# Patient Record
Sex: Male | Born: 1980 | Race: White | Hispanic: No | Marital: Married | Smoking: Never smoker
Health system: Southern US, Community
[De-identification: ages and names within clinical notes are randomized; demographics above are authoritative.]

## PROBLEM LIST (undated history)

## (undated) DIAGNOSIS — J358 Other chronic diseases of tonsils and adenoids: Secondary | ICD-10-CM

## (undated) DIAGNOSIS — Z9889 Other specified postprocedural states: Secondary | ICD-10-CM

## (undated) DIAGNOSIS — I252 Old myocardial infarction: Secondary | ICD-10-CM

## (undated) DIAGNOSIS — J039 Acute tonsillitis, unspecified: Secondary | ICD-10-CM

## (undated) DIAGNOSIS — I10 Essential (primary) hypertension: Secondary | ICD-10-CM

## (undated) HISTORY — DX: Essential (primary) hypertension: I10

## (undated) HISTORY — PX: APPENDECTOMY: SHX54

---

## 2006-11-24 ENCOUNTER — Emergency Department (HOSPITAL_COMMUNITY): Admission: EM | Admit: 2006-11-24 | Discharge: 2006-11-24 | Payer: Self-pay | Admitting: Emergency Medicine

## 2007-09-17 ENCOUNTER — Emergency Department (HOSPITAL_COMMUNITY): Admission: EM | Admit: 2007-09-17 | Discharge: 2007-09-17 | Payer: Self-pay | Admitting: Emergency Medicine

## 2010-04-04 ENCOUNTER — Emergency Department (HOSPITAL_COMMUNITY)
Admission: EM | Admit: 2010-04-04 | Discharge: 2010-04-04 | Disposition: A | Discharge status: Home / Self Care | Payer: BC Managed Care – PPO | Attending: Emergency Medicine | Admitting: Emergency Medicine

## 2010-04-04 DIAGNOSIS — J069 Acute upper respiratory infection, unspecified: Secondary | ICD-10-CM | POA: Insufficient documentation

## 2010-04-04 DIAGNOSIS — IMO0001 Reserved for inherently not codable concepts without codable children: Secondary | ICD-10-CM | POA: Insufficient documentation

## 2010-04-04 DIAGNOSIS — R509 Fever, unspecified: Secondary | ICD-10-CM | POA: Insufficient documentation

## 2010-04-04 DIAGNOSIS — J3489 Other specified disorders of nose and nasal sinuses: Secondary | ICD-10-CM | POA: Insufficient documentation

## 2010-12-07 LAB — CULTURE, ROUTINE-ABSCESS: Gram Stain: NONE SEEN

## 2011-02-27 DIAGNOSIS — Z9889 Other specified postprocedural states: Secondary | ICD-10-CM

## 2011-02-27 HISTORY — DX: Other specified postprocedural states: Z98.890

## 2011-05-18 ENCOUNTER — Other Ambulatory Visit: Payer: Self-pay | Admitting: Cardiovascular Disease

## 2011-05-18 ENCOUNTER — Encounter: Payer: Self-pay | Admitting: Unknown Physician Specialty

## 2011-05-18 ENCOUNTER — Inpatient Hospital Stay (HOSPITAL_COMMUNITY)
Admission: AD | Admit: 2011-05-18 | Discharge: 2011-05-20 | DRG: 122 | Disposition: A | Discharge status: Home / Self Care | Payer: BC Managed Care – PPO | Source: Other Acute Inpatient Hospital | Attending: Internal Medicine | Admitting: Internal Medicine

## 2011-05-18 ENCOUNTER — Encounter (HOSPITAL_COMMUNITY)
Admission: AD | Disposition: A | Discharge status: Home / Self Care | Payer: Self-pay | Source: Other Acute Inpatient Hospital | Attending: Internal Medicine

## 2011-05-18 DIAGNOSIS — R0781 Pleurodynia: Secondary | ICD-10-CM

## 2011-05-18 DIAGNOSIS — I201 Angina pectoris with documented spasm: Secondary | ICD-10-CM

## 2011-05-18 DIAGNOSIS — Z7982 Long term (current) use of aspirin: Secondary | ICD-10-CM

## 2011-05-18 DIAGNOSIS — I214 Non-ST elevation (NSTEMI) myocardial infarction: Principal | ICD-10-CM | POA: Diagnosis present

## 2011-05-18 DIAGNOSIS — R079 Chest pain, unspecified: Secondary | ICD-10-CM

## 2011-05-18 DIAGNOSIS — Z6827 Body mass index (BMI) 27.0-27.9, adult: Secondary | ICD-10-CM

## 2011-05-18 DIAGNOSIS — Z79899 Other long term (current) drug therapy: Secondary | ICD-10-CM

## 2011-05-18 DIAGNOSIS — I10 Essential (primary) hypertension: Secondary | ICD-10-CM | POA: Diagnosis present

## 2011-05-18 DIAGNOSIS — R071 Chest pain on breathing: Secondary | ICD-10-CM | POA: Diagnosis present

## 2011-05-18 HISTORY — PX: LEFT HEART CATHETERIZATION WITH CORONARY ANGIOGRAM: SHX5451

## 2011-05-18 SURGERY — LEFT HEART CATHETERIZATION WITH CORONARY ANGIOGRAM
Anesthesia: LOCAL

## 2011-05-18 MED ORDER — ACETAMINOPHEN 325 MG PO TABS
650.0000 mg | ORAL_TABLET | ORAL | Status: DC | PRN
  Administered 2011-05-18 – 2011-05-19 (×2): 650 mg via ORAL
  Filled 2011-05-18 (×3): qty 2

## 2011-05-18 MED ORDER — NITROGLYCERIN 0.4 MG SL SUBL
0.4000 mg | SUBLINGUAL_TABLET | SUBLINGUAL | Status: DC | PRN
  Administered 2011-05-18 – 2011-05-19 (×4): 0.4 mg via SUBLINGUAL
  Filled 2011-05-18: qty 25

## 2011-05-18 MED ORDER — ASPIRIN 81 MG PO CHEW: 324.0000 mg | CHEWABLE_TABLET | ORAL | Status: DC

## 2011-05-18 MED ORDER — MIDAZOLAM HCL 2 MG/2ML IJ SOLN
INTRAMUSCULAR | Status: AC
  Filled 2011-05-18: qty 2

## 2011-05-18 MED ORDER — NITROGLYCERIN 0.2 MG/ML ON CALL CATH LAB
INTRAVENOUS | Status: AC
  Filled 2011-05-18: qty 1

## 2011-05-18 MED ORDER — SODIUM CHLORIDE 0.9 % IJ SOLN: 3.0000 mL | Freq: Two times a day (BID) | INTRAMUSCULAR | Status: DC

## 2011-05-18 MED ORDER — SODIUM CHLORIDE 0.9 % IJ SOLN: 3.0000 mL | INTRAMUSCULAR | Status: DC | PRN

## 2011-05-18 MED ORDER — ACETAMINOPHEN 325 MG PO TABS: 650.0000 mg | ORAL_TABLET | ORAL | Status: DC | PRN

## 2011-05-18 MED ORDER — METOPROLOL TARTRATE 25 MG PO TABS
25.0000 mg | ORAL_TABLET | Freq: Two times a day (BID) | ORAL | Status: DC
  Administered 2011-05-18: 25 mg via ORAL
  Filled 2011-05-18 (×3): qty 1

## 2011-05-18 MED ORDER — ASPIRIN 81 MG PO CHEW
324.0000 mg | CHEWABLE_TABLET | ORAL | Status: AC
  Administered 2011-05-18: 324 mg via ORAL
  Filled 2011-05-18: qty 4

## 2011-05-18 MED ORDER — SODIUM CHLORIDE 0.9 % IV SOLN
INTRAVENOUS | Status: DC
  Administered 2011-05-19 – 2011-05-20 (×4): via INTRAVENOUS

## 2011-05-18 MED ORDER — HEPARIN (PORCINE) IN NACL 2-0.9 UNIT/ML-% IJ SOLN
INTRAMUSCULAR | Status: AC
  Filled 2011-05-18: qty 2000

## 2011-05-18 MED ORDER — LIDOCAINE HCL (PF) 1 % IJ SOLN
INTRAMUSCULAR | Status: AC
  Filled 2011-05-18: qty 30

## 2011-05-18 MED ORDER — ONDANSETRON HCL 4 MG/2ML IJ SOLN: 4.0000 mg | Freq: Four times a day (QID) | INTRAMUSCULAR | Status: DC | PRN

## 2011-05-18 MED ORDER — ASPIRIN EC 81 MG PO TBEC
81.0000 mg | DELAYED_RELEASE_TABLET | Freq: Every day | ORAL | Status: DC
  Administered 2011-05-19 – 2011-05-20 (×2): 81 mg via ORAL
  Filled 2011-05-18 (×2): qty 1

## 2011-05-18 MED ORDER — SODIUM CHLORIDE 0.9 % IV SOLN
250.0000 mL | INTRAVENOUS | Status: DC | PRN
Start: 2011-05-18 — End: 2011-05-18

## 2011-05-18 MED ORDER — ASPIRIN 300 MG RE SUPP
300.0000 mg | RECTAL | Status: AC
  Filled 2011-05-18: qty 1

## 2011-05-18 MED ORDER — FENTANYL CITRATE 0.05 MG/ML IJ SOLN
INTRAMUSCULAR | Status: AC
  Filled 2011-05-18: qty 2

## 2011-05-18 MED ORDER — SODIUM CHLORIDE 0.9 % IV SOLN
INTRAVENOUS | Status: DC
  Administered 2011-05-18: 17:00:00 via INTRAVENOUS

## 2011-05-18 MED ORDER — HEPARIN (PORCINE) IN NACL 100-0.45 UNIT/ML-% IJ SOLN
1000.0000 [IU]/h | INTRAMUSCULAR | Status: DC
  Filled 2011-05-18: qty 250

## 2011-05-18 MED ORDER — ASPIRIN 81 MG PO CHEW: 81.0000 mg | CHEWABLE_TABLET | Freq: Every day | ORAL | Status: DC

## 2011-05-18 NOTE — Op Note (Signed)
    Cardiac Cath Note  Donald Mcgrath [id number] Aug 13, 1980  Procedure: Left Heart Cardiac Catheterization Note Indications: Donald Mcgrath has been under lots of stress. He had some chest pain several days ago. He was subsequently found to have a troponin of 5.35. He has continued to have some very mild chest tightness.   Procedure Details Consent: Obtained Time Out: Verified patient identification, verified procedure, site/side was marked, verified correct patient position, special equipment/implants available, Radiology Safety Procedures followed,  medications/allergies/relevent history reviewed, required imaging and test results available.  Performed   Medications: Fentanyl: 50 mcg IV Versed: 2 mg IV  The right femoral artery was easily canulated using a modified Seldinger technique.  Hemodynamics:   LV pressure: 125/15 Aortic pressure: 121/85  Angiography   Left Main: The left main is smooth and normal.  Left anterior Descending: The left anterior descending artery is smooth and normal. There is a large first diagonal branch which is also smooth and normal.  Left Circumflex: The left circumflex artery is moderate in size. It is smooth and normal throughout its course. The first acute marginal artery is fairly small. The circumflex place artery terminates as a moderate-sized posterior lateral branch.  Ramus intermediate vessel:  The ramus intermediate vessel is smooth and normal. It is a fairly large branch.  Right Coronary Artery: The right coronary artery is large and is dominant.  It is smooth and normal throughout its course The posterior descending artery and the posterior lateral segment artery are normal.  LV Gram: The left ventriculogram was performed in the 30 RAO position.  The overall left ventricular systolic function is normal. He has a small area of akinesis in the basilar/mid anterior free wall.  I suspect that he had transient coronary spasm resulting in a very small  myocardial infarction.  Complications: No apparent complications Patient did tolerate procedure well.  Conclusions:   1. Smooth and normal coronary arteries 2. Overall normal left ventricle systolic function 3. Evidence of coronary spasm. He has a very small area of  anterior wall motion  abnormality. He has a very small area of akinesis in the basal/mid anterior wall. His coronary arteries are completely smooth and normal. I suspect that he may have had transient coronary spasm due to his stress.  Treated with low-dose beta blockers as tolerated. Anticipate discharge in the next day or so.  [name], Montez Hageman., MD, [facility] 05/18/2011, 7:04 PM

## 2011-05-18 NOTE — Plan of Care (Signed)
Problem: Phase I Progression Outcomes Goal: Aspirin unless contraindicated Outcome: Completed/Met Date Met:  05/18/11 Given at [facility]

## 2011-05-18 NOTE — Progress Notes (Signed)
ANTICOAGULATION CONSULT NOTE - Initial Consult  Pharmacy Consult for Heparin Indication: NSTEMI  No Known Allergies  Patient Measurements: Pt reported ht: 69 in Pt reported weight: 190 lbs   Heparin Dosing Weight: 85 kg  Vital Signs: Temp: 97.5 F (36.4 C) (03/22 1610) Temp src: Oral (03/22 1610) BP: 137/93 mmHg (03/22 1610) Pulse Rate: 68  (03/22 1610)  Labs: Baseline labs preformed at [facility]. No results found for this basename: HGB:2,HCT:3,PLT:3,APTT:3,LABPROT:3,INR:3,HEPARINUNFRC:3,CREATININE:3,CKTOTAL:3,CKMB:3,TROPONINI:3 in the last 72 hours CrCl is unknown because no creatinine reading has been taken and the patient has no height on file.  Medical History: No past medical history on file.  Medications:  Scheduled:    . aspirin  324 mg Oral Pre-Cath  . aspirin  324 mg Oral NOW   Or  . aspirin  300 mg Rectal NOW  . aspirin EC  81 mg Oral Daily  . metoprolol tartrate  25 mg Oral BID  . sodium chloride  3 mL Intravenous Q12H    Assessment: 31 yo M presents to [facility] with chest pain and +troponins dx with NSTEMI.  He was transferred to [facility] for cardiac cath today.  He is currently on heparin infusion 1000 units/hr started at 0915 today at [facility]. No bleeding reported.  Goal of Therapy:  Heparin level 0.3-0.7 units/ml   Plan:  1) Continue heparin 1000 units/hr 2) Follow-up after cardiac cath.  Will check heparin level if cath delayed.  Elson Clan 05/18/2011,4:16 PM

## 2011-05-18 NOTE — H&P (View-Only) (Signed)
NAME:  Donald Mcgrath, Donald Mcgrath ROOM:   UNIT NUMBER:  [id number] LOCATION: ER ADM/VISIT DATE:  05/18/2011   ADM PHYSKathaleen Grinder:  [id number] DOB: 07/05/1980   REASON FOR CONSULTATION:  Chest pain.  HISTORY OF PRESENT ILLNESS:  This is a 31 year old gentleman with no previous cardiac history.  He has past medical history of hypertension and gets treatment with metoprolol.  He reports that his father had myocardial infarction or a stroke in his 30s.  He is not aware of any other medical problems.  He has been under significant stress lately.  His wife had surgery Wednesday at [facility].  On that day, he had substernal chest discomfort which lasted about 30 minutes and resolved on its own.  He did not seek any medical attention.  He woke up at 2 in the morning with substernal aching and tightness feeling in his chest, radiating to the left arm, which lasted for about 2 hours.  It was associated with nausea, but no other symptoms.  It did not worsen with deep breath or coughing.  The patient did have a recent upper respiratory tract infection, with significant sore that started on Sunday.  He did not get treatment for this.  He denies any fevers or chills.  The patient was found to have elevated cardiac markers, with an initial troponin of 3.61, which subsequently increased to 5.35.  He is currently chest-pain-free.  He denies any previous similar symptoms.  PAST MEDICAL HISTORY: 1. Hypertension. 2. Family history of premature coronary artery disease.  HOME MEDICATIONS:  Include metoprolol.  ALLERGIES:  No known drug allergies.  FAMILY HISTORY:  His father had myocardial infarction or a stroke in his 18s.  The patient could not really tell.  He is still alive.  SOCIAL HISTORY:  He denies any smoking or recreational drug use.  He drinks alcohol occasionally.  He is married and lives with his wife.  REVIEW OF SYSTEMS:  A 10-point review of systems was performed.  It is negative other than what is mentioned  in the HPI.  PHYSICAL EXAMINATION:  General:  The patient appears to be at his stated age and in no acute distress.  Vital signs:  On presentation, his blood pressure was 154/109, currently it is 130/93.  Pulse 83, respiratory rate 18, oxygen saturation 99% on room air.  HEENT:  Normocephalic, atraumatic.  Neck:  No JVD or carotid bruits.  Respiratory:  Normal respiratory effort, with no use of accessory muscles.  Auscultation reveals normal breath sounds.  Cardiovascular:  Normal PMI, normal S1 and S2, with no gallops or murmurs.  Abdomen:  Benign, nontender, nondistended.  Extremities:  No clubbing, cyanosis, or edema.  Skin:  Warm and dry with no rash.  Psychiatric:  He is alert, oriented x3, with normal mood and affect.  LABORATORY AND DIAGNOSTIC DATA:  His ECG showed sinus bradycardia with a heart rate of 52 bpm.  There is minor anterior ST elevation.  It does not meet criteria for ST elevation myocardial infarction.  There is also minor elevation in I and AVL, but the appearance seems to be suggestive of early repolarization.  His labs showed unremarkable CBC.  D-dimer was normal at 0.25.  Chemistry showed a creatinine of 1.26, BUN 18.  Troponin initially was 3.61, and subsequently increased to 5.35.  CPK was 271 and increased to 413.  Chest x-ray showed no acute cardiopulmonary process.  IMPRESSION: 1. Non-ST-elevation myocardial infarction. 2. Hypertension.  RECOMMENDATIONS: 1. The patient has  significantly elevated cardiac markers, suggestive of non-ST-elevation myocardial infarction.  The differential diagnosis for this includes plaque rupture versus myocarditis.  His symptoms are actually more suggestive of true acute coronary syndrome.  He does report recent symptoms of upper respiratory tract infection.  However, the quality of his chest pain is not entirely consistent with that.  Other possible etiologies include spontaneous coronary dissection or stress-induced cardiomyopathy, which  is less likely. 2. At this time, I recommend continuing aspirin.  I also will go ahead and start him on unfractionated heparin.  I will obtain a stat echocardiogram to evaluate his LV systolic function and wall motion and to ensure that he has no significant pericardial effusion. 3. I recommend proceeding with urgent cardiac catheterization and possible coronary intervention.  I discussed the procedure and the risk with the patient and his mother. 4. The patient will be transferred to [facility] for this.   __________________________    Rachel Moulds, M.D. Alinda Money D: 05/18/2011 1610 T: 05/18/2011 1003 P: ARI2   Transthoracic Echocardiogram       Patient: Donald Mcgrath, Donald Mcgrath       Med Rec#: [id number] DOB: 03-28-1980 Reading: Lorine Bears, M.D.   Date: 05/18/2011 Ht: 175.26cm/68ins Sonographer: Jake Seats, RVT,RDCS   Account#: [id number] Wt: 86.1kg/189lbs   Accession#: [id number] BSA: 2.02   Room#: er 3 Type: Outpatient        Sex: M       CPT Code(s):  HR 52   Echocardiogram, Complete (93306) BP 132/77  Indication: cp, elev trop    Measurements   Chambers Aortic Valve Mitral Valve          Name Value Units Range Name Value Units Range          LVOT Vmax 1 m/s (1 to 1.7) MVDecTime 230 ms           LVOT Max Pk Grad 4 mmHg  A Vel 0.554 m/sec          Name Value Units Range LVMeanPG 2 mmHg  E/A 1 ratio (1.1 to 1.5)         RVIDd 32.1 mm (8 to 26) LVOT VTI 22.1 cm  MV Pk Vel 0.563 m/sec (0.6 to 1.3)         IVSd 10.8 mm (3 to 11) LVOT Vmean 0.709 m/s  MV Pk Grad 1 mmHg          IVSs 15.2 mm  AV (Vmax) 1.1 m/sec  MV Pk Mn Grad 1 mmHg          LVIDd 45.8 mm (38 to 57) AV Pk Grad 5 mmHg (<36) MV V Mean 0.349 m/sec          LVIDs 30.8 mm (22 to 40) AV Mn Pk Grad 3 mmHg (<20) MVPHT 79 msec          LVPWd 14.5 mm (7 to 11) AV VTI 23.8 cm  MV VTI 21.4 cm          LVPWs 18.5 mm (7 to 11) AV Vmean 0.769 m/s  MVA (PHT) 2.78 cm2          LA Diam 39 mm (15 to 40) LVOT Diam 21 mm (17 to  25) MVA (Cont) 3.57 cm2          Ao Diam 30 mm  AVA (VTI) 3.21 cm2  MR Vmax 1.38 m/sec          EF (Teich) 61.3 %  AVA Vmax 3.15 cm2  MR VTI 21.4 cm                     Tricuspid/Pulmonic Valves           Name Value Units Range           TRMaxPG 7 mmHg            RAP 10 mmHg            RVSP 17 mmHg             Findings   Technical Comments The study quality is good.   Left Ventricle The left ventricular chamber size is normal. There is no left ventricular hypertrophy. Global left ventricular wall motion and contractility are within normal limits. There is normal left ventricular systolic function. The estimated ejection fraction is 55-60%. Normal left ventricular diastolic filling is observed.   Left Atrium The left atrial chamber size is normal.   Right Ventricle The right ventricular cavity size is normal. The right ventricle wall thickness is normal. The right ventricular global systolic function is normal.   Right Atrium The right atrial cavity size is normal.   Aortic Valve The aortic valve structure is normal. No aortic leaflet calcification is visualized. There is no evidence of aortic regurgitation. There is no evidence of aortic stenosis.   Mitral Valve The mitral valve leaflets appear normal. Mitral valve leaflet mobility appears normal. There is no evidence of mitral regurgitation. There is no evidence of mitral stenosis.   Tricuspid Valve The tricuspid valve leaflets are normal. Tricuspid valve leaflet mobility appears normal. There is no evidence of tricuspid valve regurgitation. Unable to estimate the right ventricular systolic pressure. There is no tricuspid stenosis.   Pulmonic Valve The pulmonic valve appears normal.   Pericardium The pericardium appears normal. There is no pericardial effusion.   Aorta There is no dilatation of the ascending aorta. There is no dilatation of the aortic root.   Pulmonary Artery The main pulmonary artery is not well visualized.   Venous The  inferior vena cava is not visualized.       Conclusions: 1. There is normal left ventricular systolic function.   2. The estimated ejection fraction is 55-60%.    3. Normal left ventricular diastolic filling is observed.   4. The right ventricular global systolic function is normal.   5. There is no pericardial effusion.   6. No significant valvular abnormalities.    Wallmotion Diagram      Electronically signed at 05/18/2011 12:00:24 by: Lorine Bears, M.D.

## 2011-05-18 NOTE — H&P (Signed)
NAME:  Donald Mcgrath, Donald Mcgrath ROOM:   UNIT NUMBER:  [id number] LOCATION: ER ADM/VISIT DATE:  05/18/2011   ADM PHYS:  ACCT:  [id number] DOB: 04/04/1980   REASON FOR CONSULTATION:  Chest pain.  HISTORY OF PRESENT ILLNESS:  This is a 30-year-old gentleman with no previous cardiac history.  He has past medical history of hypertension and gets treatment with metoprolol.  He reports that his father had myocardial infarction or a stroke in his 30s.  He is not aware of any other medical problems.  He has been under significant stress lately.  His wife had surgery Wednesday at [facility].  On that day, he had substernal chest discomfort which lasted about 30 minutes and resolved on its own.  He did not seek any medical attention.  He woke up at 2 in the morning with substernal aching and tightness feeling in his chest, radiating to the left arm, which lasted for about 2 hours.  It was associated with nausea, but no other symptoms.  It did not worsen with deep breath or coughing.  The patient did have a recent upper respiratory tract infection, with significant sore that started on Sunday.  He did not get treatment for this.  He denies any fevers or chills.  The patient was found to have elevated cardiac markers, with an initial troponin of 3.61, which subsequently increased to 5.35.  He is currently chest-pain-free.  He denies any previous similar symptoms.  PAST MEDICAL HISTORY: 1. Hypertension. 2. Family history of premature coronary artery disease.  HOME MEDICATIONS:  Include metoprolol.  ALLERGIES:  No known drug allergies.  FAMILY HISTORY:  His father had myocardial infarction or a stroke in his 30s.  The patient could not really tell.  He is still alive.  SOCIAL HISTORY:  He denies any smoking or recreational drug use.  He drinks alcohol occasionally.  He is married and lives with his wife.  REVIEW OF SYSTEMS:  A 10-point review of systems was performed.  It is negative other than what is mentioned  in the HPI.  PHYSICAL EXAMINATION:  General:  The patient appears to be at his stated age and in no acute distress.  Vital signs:  On presentation, his blood pressure was 154/109, currently it is 130/93.  Pulse 83, respiratory rate 18, oxygen saturation 99% on room air.  HEENT:  Normocephalic, atraumatic.  Neck:  No JVD or carotid bruits.  Respiratory:  Normal respiratory effort, with no use of accessory muscles.  Auscultation reveals normal breath sounds.  Cardiovascular:  Normal PMI, normal S1 and S2, with no gallops or murmurs.  Abdomen:  Benign, nontender, nondistended.  Extremities:  No clubbing, cyanosis, or edema.  Skin:  Warm and dry with no rash.  Psychiatric:  He is alert, oriented x3, with normal mood and affect.  LABORATORY AND DIAGNOSTIC DATA:  His ECG showed sinus bradycardia with a heart rate of 52 bpm.  There is minor anterior ST elevation.  It does not meet criteria for ST elevation myocardial infarction.  There is also minor elevation in I and AVL, but the appearance seems to be suggestive of early repolarization.  His labs showed unremarkable CBC.  D-dimer was normal at 0.25.  Chemistry showed a creatinine of 1.26, BUN 18.  Troponin initially was 3.61, and subsequently increased to 5.35.  CPK was 271 and increased to 413.  Chest x-ray showed no acute cardiopulmonary process.  IMPRESSION: 1. Non-ST-elevation myocardial infarction. 2. Hypertension.  RECOMMENDATIONS: 1. The patient has   significantly elevated cardiac markers, suggestive of non-ST-elevation myocardial infarction.  The differential diagnosis for this includes plaque rupture versus myocarditis.  His symptoms are actually more suggestive of true acute coronary syndrome.  He does report recent symptoms of upper respiratory tract infection.  However, the quality of his chest pain is not entirely consistent with that.  Other possible etiologies include spontaneous coronary dissection or stress-induced cardiomyopathy, which  is less likely. 2. At this time, I recommend continuing aspirin.  I also will go ahead and start him on unfractionated heparin.  I will obtain a stat echocardiogram to evaluate his LV systolic function and wall motion and to ensure that he has no significant pericardial effusion. 3. I recommend proceeding with urgent cardiac catheterization and possible coronary intervention.  I discussed the procedure and the risk with the patient and his mother. 4. The patient will be transferred to [facility] for this.   __________________________    MOHAMMAD Easten Maceachern, M.D. /landm D: 05/18/2011 0934 T: 05/18/2011 1003 P: ARI2   Transthoracic Echocardiogram       Patient: Donald Mcgrath, Donald Mcgrath       Med Rec#: [id number] DOB: 11/23/1980 Reading: Kensington Duerst, M.D.   Date: 05/18/2011 Ht: 175.26cm/68ins Sonographer: Amanda McFatter, RVT,RDCS   Account#: [id number] Wt: 86.1kg/189lbs   Accession#: [id number] BSA: 2.02   Room#: er 3 Type: Outpatient        Sex: M       CPT Code(s):  HR 52   Echocardiogram, Complete (93306) BP 132/77  Indication: cp, elev trop    Measurements   Chambers Aortic Valve Mitral Valve          Name Value Units Range Name Value Units Range          LVOT Vmax 1 m/s (1 to 1.7) MVDecTime 230 ms           LVOT Max Pk Grad 4 mmHg  A Vel 0.554 m/sec          Name Value Units Range LVMeanPG 2 mmHg  E/A 1 ratio (1.1 to 1.5)         RVIDd 32.1 mm (8 to 26) LVOT VTI 22.1 cm  MV Pk Vel 0.563 m/sec (0.6 to 1.3)         IVSd 10.8 mm (3 to 11) LVOT Vmean 0.709 m/s  MV Pk Grad 1 mmHg          IVSs 15.2 mm  AV (Vmax) 1.1 m/sec  MV Pk Mn Grad 1 mmHg          LVIDd 45.8 mm (38 to 57) AV Pk Grad 5 mmHg (<36) MV V Mean 0.349 m/sec          LVIDs 30.8 mm (22 to 40) AV Mn Pk Grad 3 mmHg (<20) MVPHT 79 msec          LVPWd 14.5 mm (7 to 11) AV VTI 23.8 cm  MV VTI 21.4 cm          LVPWs 18.5 mm (7 to 11) AV Vmean 0.769 m/s  MVA (PHT) 2.78 cm2          LA Diam 39 mm (15 to 40) LVOT Diam 21 mm (17 to  25) MVA (Cont) 3.57 cm2          Ao Diam 30 mm  AVA (VTI) 3.21 cm2  MR Vmax 1.38 m/sec          EF (Teich) 61.3 %  AVA Vmax 3.15 cm2    MR VTI 21.4 cm                     Tricuspid/Pulmonic Valves           Name Value Units Range           TRMaxPG 7 mmHg            RAP 10 mmHg            RVSP 17 mmHg             Findings   Technical Comments The study quality is good.   Left Ventricle The left ventricular chamber size is normal. There is no left ventricular hypertrophy. Global left ventricular wall motion and contractility are within normal limits. There is normal left ventricular systolic function. The estimated ejection fraction is 55-60%. Normal left ventricular diastolic filling is observed.   Left Atrium The left atrial chamber size is normal.   Right Ventricle The right ventricular cavity size is normal. The right ventricle wall thickness is normal. The right ventricular global systolic function is normal.   Right Atrium The right atrial cavity size is normal.   Aortic Valve The aortic valve structure is normal. No aortic leaflet calcification is visualized. There is no evidence of aortic regurgitation. There is no evidence of aortic stenosis.   Mitral Valve The mitral valve leaflets appear normal. Mitral valve leaflet mobility appears normal. There is no evidence of mitral regurgitation. There is no evidence of mitral stenosis.   Tricuspid Valve The tricuspid valve leaflets are normal. Tricuspid valve leaflet mobility appears normal. There is no evidence of tricuspid valve regurgitation. Unable to estimate the right ventricular systolic pressure. There is no tricuspid stenosis.   Pulmonic Valve The pulmonic valve appears normal.   Pericardium The pericardium appears normal. There is no pericardial effusion.   Aorta There is no dilatation of the ascending aorta. There is no dilatation of the aortic root.   Pulmonary Artery The main pulmonary artery is not well visualized.   Venous The  inferior vena cava is not visualized.       Conclusions: 1. There is normal left ventricular systolic function.   2. The estimated ejection fraction is 55-60%.    3. Normal left ventricular diastolic filling is observed.   4. The right ventricular global systolic function is normal.   5. There is no pericardial effusion.   6. No significant valvular abnormalities.    Wallmotion Diagram      Electronically signed at 05/18/2011 12:00:24 by: Logan Vegh, M.D.      

## 2011-05-18 NOTE — Progress Notes (Signed)
Patient admitted from [facility] via [facility].  Scheduled for Cardiac Cath today.  Spoke with patient, he remembers MD telling him he was going for procedure, but does not remember MD reviewing risk and benefits.  Trish Cardmaster paged at time of admission and notified for someone to come up and speak with patient.  Spoke with Selena Batten in Cath lab and she notified patient was not consented for procedure as of yet.  Patient viewing Cath video and asking questions regarding procedure and risk.  Will continue to monitor.  Colman Cater

## 2011-05-18 NOTE — Interval H&P Note (Signed)
History and Physical Interval Note:  05/18/2011 6:20 PM  [name]  has presented today for surgery, with the diagnosis of chest pain  The various methods of treatment have been discussed with the patient and family. After consideration of risks, benefits and other options for treatment, the patient has consented to  Procedure(s) (LRB): LEFT HEART CATHETERIZATION WITH CORONARY ANGIOGRAM (N/A) as a surgical intervention .  The patients' history has been reviewed, patient examined, no change in status, stable for surgery.  I have reviewed the patients' chart and labs.  Questions were answered to the patient's satisfaction.    Donald Mcgrath is a 31 year old gentleman with a family history of atherosclerotic disease. He presents with episodes of chest pain. He still has some mild chest heaviness. His troponin is fairly significantly elevated.  His EKG reveals some hyperacute T waves in the anterior leads but otherwise no significant abnormalities. His echo reveals normal left ventricular systolic function.  I agree with cardiac catheterization. I discussed the risks, benefits, and options. He understands and agrees to proceed.  Elyn Aquas.

## 2011-05-19 ENCOUNTER — Other Ambulatory Visit: Payer: Self-pay

## 2011-05-19 ENCOUNTER — Encounter (HOSPITAL_COMMUNITY): Payer: Self-pay | Admitting: *Deleted

## 2011-05-19 ENCOUNTER — Inpatient Hospital Stay (HOSPITAL_COMMUNITY): Payer: BC Managed Care – PPO

## 2011-05-19 DIAGNOSIS — I201 Angina pectoris with documented spasm: Secondary | ICD-10-CM

## 2011-05-19 DIAGNOSIS — I214 Non-ST elevation (NSTEMI) myocardial infarction: Principal | ICD-10-CM

## 2011-05-19 DIAGNOSIS — R0781 Pleurodynia: Secondary | ICD-10-CM

## 2011-05-19 LAB — CBC
Hemoglobin: 13.2 g/dL (ref 13.0–17.0)
MCHC: 32.8 g/dL (ref 30.0–36.0)
RDW: 13.9 % (ref 11.5–15.5)
WBC: 9.4 10*3/uL (ref 4.0–10.5)

## 2011-05-19 LAB — BASIC METABOLIC PANEL
BUN: 12 mg/dL (ref 6–23)
Creatinine, Ser: 1.11 mg/dL (ref 0.50–1.35)
GFR calc Af Amer: >90 mL/min (ref >90)
GFR calc non Af Amer: 89 mL/min — ABNORMAL LOW (ref >90)
Potassium: 3.8 mEq/L (ref 3.5–5.1)

## 2011-05-19 LAB — LIPID PANEL
Cholesterol: 107 mg/dL (ref 0–200)
Triglycerides: 72 mg/dL (ref <150)

## 2011-05-19 MED ORDER — IOHEXOL 350 MG/ML SOLN
80.0000 mL | Freq: Once | INTRAVENOUS | Status: AC | PRN
  Administered 2011-05-19: 80 mL via INTRAVENOUS

## 2011-05-19 MED ORDER — AMLODIPINE BESYLATE 5 MG PO TABS
5.0000 mg | ORAL_TABLET | Freq: Every day | ORAL | Status: DC
  Administered 2011-05-19 – 2011-05-20 (×2): 5 mg via ORAL
  Filled 2011-05-19 (×2): qty 1

## 2011-05-19 NOTE — Progress Notes (Signed)
PT WOKE UP EXPERIENCING CHEST PAIN MUCH LIKE THE PAIN HE EXPERIENCED ON ORIGINAL ADMISSION 2 NITRO GIVEN, EKG COMPLETED AND CHRIS HALL WAS CONTACTED NO FURTHER ADVISEMENT NEEDED

## 2011-05-19 NOTE — Progress Notes (Signed)
Pt  C/o Cp w/ radiation down L arm. At worst 7/10. Given total 3 NTG SL,placed on O2  And 12 lead EKG obtained. No changes noted . S/w Natalia Leatherwood PA for [facility] who reviewed EKG. No new orders at this time. Cp now releived. Barbera Setters

## 2011-05-19 NOTE — Progress Notes (Signed)
  Patient Name: Donald Mcgrath      SUBJECTIVE: admitted with chest pain and a non-STEMI. He underwent catheterization yesterday and small wall motion abnormality in the anterior free wall. No significant obstruction was found. It is presumed that he had spasm currently on beta blockers and aspirin  He's had a pleuritic component to this discomfort; there has been some discomfort this morning.  He does not denies use of recreational drugs. His wife does smoke.  No past medical history on file.  PHYSICAL EXAM Filed Vitals:   05/18/11 1610 05/18/11 1821 05/18/11 1955 05/19/11 0645  BP: 137/93  149/96 133/87  Pulse: 68 67 3 60  Temp: 97.5 F (36.4 C)  97.6 F (36.4 C) 97.8 F (36.6 C)  TempSrc: Oral  Oral Oral  Resp: 18  20   SpO2: 99%  98%    Well developed and nourished in no acute distress HENT normal Neck supple with JVP-flat Clear Regular rate and rhythm, no murmurs or gallops Abd-soft with active BS No Clubbing cyanosis edema Skin-warm and dry A & Oriented  Grossly normal sensory and motor function   TELEMETRY: Reviewed telemetry pt in nsr:    Intake/Output Summary (Last 24 hours) at 05/19/11 0830 Last data filed at 05/19/11 0700  Gross per 24 hour  Intake    230 ml  Output      0 ml  Net    230 ml      ASSESSMENT AND PLAN:  Patient Active Hospital Problem List: NSTEMI (non-ST elevated myocardial infarction) (05/18/2011)   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 Because of that, we will stop his beta blocker and put him on a calcium blocker. He will likely need this long-term. Normal left ventricular function suggests that this is not myocarditis.  There is also a pleuritic component to this. While there is been no shortness of breath, I think we need to exclude pulmonary embolism and will do a CT scan.  Signed, Sherryl Manges  MD  05/19/2011

## 2011-05-20 MED ORDER — NITROGLYCERIN 0.4 MG SL SUBL: 0.4000 mg | SUBLINGUAL_TABLET | SUBLINGUAL | Status: DC | PRN

## 2011-05-20 MED ORDER — AMLODIPINE BESYLATE 5 MG PO TABS: 5.0000 mg | ORAL_TABLET | Freq: Every day | ORAL | Status: DC

## 2011-05-20 MED ORDER — ASPIRIN 81 MG PO TBEC: 81.0000 mg | DELAYED_RELEASE_TABLET | Freq: Every day | ORAL | Status: AC

## 2011-05-20 NOTE — Progress Notes (Signed)
Patient discharged home in stable condition. Verbalizes understanding of all discharge instructions, including home medications and follow up appointments. 

## 2011-05-20 NOTE — Progress Notes (Signed)
  Patient Name: Donald Mcgrath      SUBJECTIVE: admitted with chest pain and a non-STEMI. He underwent catheterization yesterday and small wall motion abnormality in the anterior free wall. No significant obstruction was found. It is presumed that he had spasm . Was on beta blockers and these were switched to calcium blockers yesterday because of a pleuritic component, he underwent CT scan to rule out pulmonary embolism. This was negative.     He does not denies use of recreational drugs. His wife does smoke.  Past Medical History  Diagnosis Date  . No active medical problems   . Family history of premature coronary heart disease 05/19/2011    PHYSICAL EXAM Filed Vitals:   05/19/11 1200 05/19/11 1410 05/19/11 2100 05/20/11 0500  BP:  136/67 128/77 136/84  Pulse:  79 83 63  Temp:  97.7 F (36.5 C) 98.2 F (36.8 C) 97.6 F (36.4 C)  TempSrc:  Oral Oral Oral  Resp:  18 18 18   Height: 5\' 9"  (1.753 m)     Weight: 186 lb (84.369 kg)     SpO2:  96% 97% 98%   Well developed and nourished in no acute distress HENT normal Neck supple with JVP-flat Clear Regular rate and rhythm, no murmurs or gallops Abd-soft with active BS No Clubbing cyanosis edema Skin-warm and dry A & Oriented  Grossly normal sensory and motor function   TELEMETRY: Reviewed telemetry pt in nsr:    Intake/Output Summary (Last 24 hours) at 05/20/11 0842 Last data filed at 05/19/11 1700  Gross per 24 hour  Intake    480 ml  Output      0 ml  Net    480 ml      ASSESSMENT AND PLAN:  Patient Active Hospital Problem List: NSTEMI (non-ST elevated myocardial infarction) (05/18/2011)   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 Because of that, we will stop his beta blocker and put him on a calcium blocker. He will likely need this long-term. Normal left ventricular function suggests that  this is not myocarditis.  Home today on CCB and ASA  no work until week from [employer], Sherryl Manges MD  05/20/2011

## 2011-05-20 NOTE — Discharge Summary (Signed)
Discharge Summary   Patient ID: Donald Mcgrath MRN: [id number], DOB/AGE: 1980-05-12 31 y.o. Admit date: 05/18/2011 D/C date:     05/20/2011    Primary Cardiologist:  Dr. Lorine Bears  Primary Electrophysiologist:  None   Reason for Admission:  NSTEMI  Primary Discharge Diagnoses:  1.  NSTEMI      - Normal coronary arteries by [facility] this admission      - Likely coronary vasospasm (Norvasc added this admission)    Secondary Discharge Diagnoses:   Past Medical History  Diagnosis Date  . No active medical problems   . Family history of premature coronary heart disease 05/19/2011     Procedures Performed This Admission:    [facility] 05/18/2011 (Dr. Elease Hashimoto):  Left Main: The left main is smooth and normal. Left anterior Descending: The left anterior descending artery is smooth and normal. There is a large first diagonal branch which is also smooth and normal.  Left Circumflex: The left circumflex artery is moderate in size. It is smooth and normal throughout its course. The first acute marginal artery is fairly small. The circumflex place artery terminates as a moderate-sized posterior lateral branch.  Ramus intermediate vessel: The ramus intermediate vessel is smooth and normal. It is a fairly large branch.  Right Coronary Artery: The right coronary artery is large and is dominant. It is smooth and normal throughout its course The posterior descending artery and the posterior lateral segment artery are normal.  LV Gram: The left ventriculogram was performed in the 30 RAO position. The overall left ventricular systolic function is normal. He has a small area of akinesis in the basilar/mid anterior free wall. I suspect that he had transient coronary spasm resulting in a very small myocardial infarction.  Complications: No apparent complications  Patient did tolerate procedure well.   Conclusions:  1. Smooth and normal coronary arteries  2. Overall normal left ventricle systolic function  3.  Evidence of coronary spasm. He has a very small area of anterior wall motion abnormality. He has a very small area of akinesis in the basal/mid anterior wall. His coronary arteries are completely smooth and normal. I suspect that he may have had transient coronary spasm due to his stress.   Echocardiogram performed at [facility] 05/18/11:  Conclusions:  1. There is normal left ventricular systolic function.    2. The estimated ejection fraction is 55-60%.    3. Normal left ventricular diastolic filling is observed.    4. The right ventricular global systolic function is normal.    5. There is no pericardial effusion.    6. No significant valvular abnormalities.      Admission History:   Donald Mcgrath is a 31 y.o. male who presented to [facility] with prolonged chest pain.  He had had a recent URI.  Cardiac markers were elevated (initial troponin was 3.61) ruling him in for NSTEMI.  He was transferred to [facility] for further evaluation and management.    Hospital Course:   Cardiac cath was performed on 05/18/11 and demonstrated normal coronary arteries.  He had a very small area of akinesis in the basal/mid anterior free wall.  It was suspected he had a coronary vasospasm due to increased stress that caused his NSTEMI.  He had more chest pain on 3/23.  Beta blocker was d/c'd.  He was placed on calcium channel blocker.  He did have a pleuritic component to his pain.  A chest CT was done and was negative for pulmonary  embolism.  He was doing well on the morning of d/c.  He was seen by Dr. Sherryl Manges and felt to stable for discharge to home.     Discharge Vitals: Blood pressure 136/84, pulse 63, temperature 97.6 F (36.4 C), temperature source Oral, resp. rate 18, height 5\' 9"  (1.753 m), weight 186 lb (84.369 kg), SpO2 98.00%.  Labs: Lab Results  Component Value Date   WBC 9.4 05/19/2011   HGB 13.2 05/19/2011   HCT 40.2 05/19/2011   MCV 85.7 05/19/2011   PLT 237 05/19/2011    Lab  05/19/11 0800  NA 139  K 3.8  CL 107  CO2 25  BUN 12  CREATININE 1.11  CALCIUM 8.7  PROT --  BILITOT --  ALKPHOS --  ALT --  AST --  GLUCOSE 100*    Lab Results  Component Value Date   CHOL 107 05/19/2011   HDL 29* 05/19/2011   LDLCALC 64 05/19/2011   TRIG 72 05/19/2011    Diagnostic Studies/Procedures:  Ct Angio Chest W/cm &/or Wo Cm  05/19/2011   IMPRESSION: Mildly motion degraded exam.  Given this factor, no of evidence pulmonary embolism or other explanation for patient's symptoms.  Original Report Authenticated By: Consuello Bossier, M.D.    Discharge Medications   Medication List  As of 05/20/2011 11:13 AM   STOP taking these medications         metoprolol succinate 50 MG 24 hr tablet         TAKE these medications         amLODipine 5 MG tablet   Commonly known as: NORVASC   Take 1 tablet (5 mg total) by mouth daily.      aspirin 81 MG EC tablet   Take 1 tablet (81 mg total) by mouth daily.      nitroGLYCERIN 0.4 MG SL tablet   Commonly known as: NITROSTAT   Place 1 tablet (0.4 mg total) under the tongue every 5 (five) minutes x 3 doses as needed for chest pain.            Disposition   The patient will be discharged in stable condition to home. Discharge Orders    Future Orders Please Complete By Expires   Diet - low sodium heart healthy      Increase activity slowly      Other Restrictions      Comments:   May return to work on or after May 28, 2011   Discharge wound care:      Comments:   Call [phone] for any swelling, bleeding, bruising at site or development of fever.   Driving Restrictions      Comments:   No driving for one week.     Follow-up Information    Follow up with Lorine Bears, MD in 2 weeks. (our office will call you to arrange)    Contact information:   [address] [place] [place] [address] [phone]            Duration of Discharge Encounter: Greater than 30 minutes including physician and PA  time.  Signed, Tereso Newcomer, PA-C  11:13 AM 05/20/2011        [address] [place], [place]  [address] Phone: [phone] Fax:  [phone]

## 2011-05-21 MED FILL — Heparin Sodium (Porcine) 100 Unt/ML in Sodium Chloride 0.45%: INTRAMUSCULAR | Qty: 250 | Status: AC

## 2011-06-12 ENCOUNTER — Encounter: Payer: Self-pay | Admitting: Cardiology

## 2011-06-12 ENCOUNTER — Ambulatory Visit (INDEPENDENT_AMBULATORY_CARE_PROVIDER_SITE_OTHER): Payer: BC Managed Care – PPO | Admitting: Cardiology

## 2011-06-12 VITALS — BP 127/78 | HR 58 | Ht 68.0 in | Wt 181.0 lb

## 2011-06-12 DIAGNOSIS — I209 Angina pectoris, unspecified: Secondary | ICD-10-CM

## 2011-06-12 DIAGNOSIS — I201 Angina pectoris with documented spasm: Secondary | ICD-10-CM

## 2011-06-12 NOTE — Assessment & Plan Note (Signed)
The patient has presumed coronary vasospasm. He's having no further symptoms. I will keep him on a calcium channel blocker and advised him to continue his sublingual nitroglycerin as needed. Further evaluation will be based on future symptoms.

## 2011-06-12 NOTE — Patient Instructions (Signed)
Your physician wants you to follow-up in: 6 months. You will receive a reminder letter in the mail one-two months in advance. If you don't receive a letter, please call our office to schedule the follow-up appointment. Your physician recommends that you continue on your current medications as directed. Please refer to the Current Medication list given to you today. 

## 2011-06-12 NOTE — Progress Notes (Signed)
   HPI The patient was recently hospitalized with a non-Q wave myocardial infarction. He was thought possibly to have coronary vasospasm as a cardiac catheterization demonstrated normal coronaries.  Since that hospitalization he has had occasional fleeting chest pain. He has taken some nitroglycerin but not in the past several days. He's been active and he denies any inducible chest discomfort with activity. He's had no palpitations, presyncope or syncope. He has had no PND or orthopnea. He has had no weight gain or edema.  No Known Allergies  Current Outpatient Prescriptions  Medication Sig Dispense Refill  . amLODipine (NORVASC) 5 MG tablet Take 1 tablet (5 mg total) by mouth daily.  30 tablet  11  . aspirin EC 81 MG EC tablet Take 1 tablet (81 mg total) by mouth daily.      . citalopram (CELEXA) 20 MG tablet Take 20 mg by mouth daily.      . nitroGLYCERIN (NITROSTAT) 0.4 MG SL tablet Place 1 tablet (0.4 mg total) under the tongue every 5 (five) minutes x 3 doses as needed for chest pain.  25 tablet  11    Past Medical History  Diagnosis Date  . No active medical problems   . Family history of premature coronary heart disease 05/19/2011    Past Surgical History  Procedure Date  . No past surgeries     ROS:  As stated in the HPI and negative for all other systems.  PHYSICAL EXAM BP 127/78  Pulse 58  Ht 5\' 8"  (1.727 m)  Wt 181 lb (82.101 kg)  BMI 27.52 kg/m2  SpO2 97% GENERAL:  Well appearing HEENT:  Pupils equal round and reactive, fundi not visualized, oral mucosa unremarkable NECK:  No jugular venous distention, waveform within normal limits, carotid upstroke brisk and symmetric, no bruits, no thyromegaly LYMPHATICS:  No cervical, inguinal adenopathy LUNGS:  Clear to auscultation bilaterally BACK:  No CVA tenderness CHEST:  Unremarkable HEART:  PMI not displaced or sustained,S1 and S2 within normal limits, no S3, no S4, no clicks, no rubs, no murmurs ABD:  Flat, positive  bowel sounds normal in frequency in pitch, no bruits, no rebound, no guarding, no midline pulsatile mass, no hepatomegaly, no splenomegaly EXT:  2 plus pulses throughout, no edema, no cyanosis no clubbing SKIN:  No rashes no nodules NEURO:  Cranial nerves II through XII grossly intact, motor grossly intact throughout PSYCH:  Cognitively intact, oriented to person place and time   ASSESSMENT AND PLAN

## 2011-10-04 ENCOUNTER — Ambulatory Visit (INDEPENDENT_AMBULATORY_CARE_PROVIDER_SITE_OTHER): Payer: BC Managed Care – PPO | Admitting: Psychology

## 2011-10-04 DIAGNOSIS — F988 Other specified behavioral and emotional disorders with onset usually occurring in childhood and adolescence: Secondary | ICD-10-CM

## 2011-10-04 DIAGNOSIS — F419 Anxiety disorder, unspecified: Secondary | ICD-10-CM

## 2011-10-04 DIAGNOSIS — F411 Generalized anxiety disorder: Secondary | ICD-10-CM

## 2011-10-22 ENCOUNTER — Encounter (HOSPITAL_COMMUNITY): Payer: Self-pay | Admitting: *Deleted

## 2011-10-22 ENCOUNTER — Encounter (HOSPITAL_COMMUNITY): Payer: Self-pay | Admitting: Psychology

## 2011-10-22 ENCOUNTER — Ambulatory Visit (INDEPENDENT_AMBULATORY_CARE_PROVIDER_SITE_OTHER): Payer: BC Managed Care – PPO | Admitting: Psychology

## 2011-10-22 DIAGNOSIS — F908 Attention-deficit hyperactivity disorder, other type: Secondary | ICD-10-CM

## 2011-10-22 DIAGNOSIS — F909 Attention-deficit hyperactivity disorder, unspecified type: Secondary | ICD-10-CM
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
## 2011-10-24 ENCOUNTER — Encounter (HOSPITAL_COMMUNITY): Payer: Self-pay | Admitting: Psychology
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
## 2011-11-06 ENCOUNTER — Encounter (HOSPITAL_COMMUNITY): Payer: Self-pay | Admitting: *Deleted

## 2011-11-06 ENCOUNTER — Encounter (HOSPITAL_COMMUNITY): Payer: Self-pay | Admitting: Psychology

## 2011-11-06 ENCOUNTER — Ambulatory Visit (INDEPENDENT_AMBULATORY_CARE_PROVIDER_SITE_OTHER): Payer: BC Managed Care – PPO | Admitting: Psychology

## 2011-11-06 DIAGNOSIS — F0781 Postconcussional syndrome: Secondary | ICD-10-CM

## 2011-11-15 ENCOUNTER — Other Ambulatory Visit (HOSPITAL_COMMUNITY): Payer: Self-pay | Admitting: Psychiatry

## 2011-11-22 ENCOUNTER — Encounter (HOSPITAL_COMMUNITY): Payer: Self-pay | Admitting: Psychology
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
## 2012-01-20 ENCOUNTER — Emergency Department (HOSPITAL_COMMUNITY)
Admission: EM | Admit: 2012-01-20 | Discharge: 2012-01-20 | Disposition: A | Discharge status: Home / Self Care | Payer: BC Managed Care – PPO | Attending: Emergency Medicine | Admitting: Emergency Medicine

## 2012-01-20 ENCOUNTER — Encounter (HOSPITAL_COMMUNITY): Payer: Self-pay | Admitting: *Deleted

## 2012-01-20 ENCOUNTER — Emergency Department (HOSPITAL_COMMUNITY): Payer: BC Managed Care – PPO

## 2012-01-20 DIAGNOSIS — Z79899 Other long term (current) drug therapy: Secondary | ICD-10-CM | POA: Insufficient documentation

## 2012-01-20 DIAGNOSIS — I10 Essential (primary) hypertension: Secondary | ICD-10-CM | POA: Insufficient documentation

## 2012-01-20 DIAGNOSIS — Z7982 Long term (current) use of aspirin: Secondary | ICD-10-CM | POA: Insufficient documentation

## 2012-01-20 DIAGNOSIS — M7989 Other specified soft tissue disorders: Secondary | ICD-10-CM | POA: Insufficient documentation

## 2012-01-20 DIAGNOSIS — S61432A Puncture wound without foreign body of left hand, initial encounter: Secondary | ICD-10-CM

## 2012-01-20 DIAGNOSIS — Z87828 Personal history of other (healed) physical injury and trauma: Secondary | ICD-10-CM | POA: Insufficient documentation

## 2012-01-20 MED ORDER — DOUBLE ANTIBIOTIC 500-10000 UNIT/GM EX OINT
TOPICAL_OINTMENT | Freq: Once | CUTANEOUS | Status: AC
  Administered 2012-01-20: 19:00:00 via TOPICAL

## 2012-01-20 MED ORDER — CEFTRIAXONE SODIUM 1 G IJ SOLR
INTRAMUSCULAR | Status: AC
  Filled 2012-01-20: qty 20

## 2012-01-20 MED ORDER — DOUBLE ANTIBIOTIC 500-10000 UNIT/GM EX OINT
TOPICAL_OINTMENT | CUTANEOUS | Status: AC
  Filled 2012-01-20: qty 1

## 2012-01-20 MED ORDER — DEXTROSE 5 % IV SOLN
2.0000 g | Freq: Once | INTRAVENOUS | Status: AC
  Administered 2012-01-20: 2 g via INTRAVENOUS

## 2012-01-20 MED ORDER — OXYCODONE-ACETAMINOPHEN 5-325 MG PO TABS: 2.0000 | ORAL_TABLET | ORAL | Status: DC | PRN

## 2012-01-20 MED ORDER — AMOXICILLIN-POT CLAVULANATE 875-125 MG PO TABS: 1.0000 | ORAL_TABLET | Freq: Two times a day (BID) | ORAL | Status: DC

## 2012-01-20 NOTE — ED Notes (Signed)
Patient with no complaints at this time. Respirations even and unlabored. Skin warm/dry. Discharge instructions reviewed with patient at this time. Patient given opportunity to voice concerns/ask questions. IV removed per policy and band-aid applied to site. Patient discharged at this time and left Emergency Department with steady gait.  
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
## 2012-01-22 ENCOUNTER — Encounter: Payer: Self-pay | Admitting: Psychology

## 2012-09-02 ENCOUNTER — Other Ambulatory Visit: Payer: Self-pay | Admitting: Cardiology

## 2012-09-02 MED ORDER — AMLODIPINE BESYLATE 5 MG PO TABS: 5.0000 mg | ORAL_TABLET | Freq: Every day | ORAL | Status: DC

## 2013-02-06 ENCOUNTER — Encounter: Payer: Self-pay | Admitting: Cardiology

## 2013-02-06 ENCOUNTER — Ambulatory Visit (INDEPENDENT_AMBULATORY_CARE_PROVIDER_SITE_OTHER): Payer: BC Managed Care – PPO | Admitting: Cardiology

## 2013-02-06 VITALS — BP 144/98 | HR 67 | Ht 68.0 in | Wt 194.0 lb

## 2013-02-06 DIAGNOSIS — R0781 Pleurodynia: Secondary | ICD-10-CM

## 2013-02-06 DIAGNOSIS — I10 Essential (primary) hypertension: Secondary | ICD-10-CM

## 2013-02-06 DIAGNOSIS — R071 Chest pain on breathing: Secondary | ICD-10-CM

## 2013-02-06 MED ORDER — AMLODIPINE BESYLATE 5 MG PO TABS: 5.0000 mg | ORAL_TABLET | Freq: Every day | ORAL | Status: DC
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
## 2013-02-06 NOTE — Patient Instructions (Addendum)
Your physician recommends that you schedule a follow-up appointment in: 3 months with Dr. Wyline Mood. This appointment will be scheduled today before you leave.   Your physician has recommended you make the following change in your medication:  Start: Aspirin 81 MG once daily Start: Amlodipine (Norvasc) 5 MG once daily  Continue all other medications the same

## 2013-05-19 ENCOUNTER — Encounter: Payer: Self-pay | Admitting: Cardiology

## 2013-05-19 ENCOUNTER — Ambulatory Visit (INDEPENDENT_AMBULATORY_CARE_PROVIDER_SITE_OTHER): Payer: BC Managed Care – PPO | Admitting: Cardiology

## 2013-05-19 VITALS — BP 135/88 | HR 56 | Ht 68.0 in | Wt 196.0 lb

## 2013-05-19 DIAGNOSIS — I10 Essential (primary) hypertension: Secondary | ICD-10-CM

## 2013-05-19 DIAGNOSIS — I209 Angina pectoris, unspecified: Secondary | ICD-10-CM

## 2013-05-19 DIAGNOSIS — I201 Angina pectoris with documented spasm: Secondary | ICD-10-CM

## 2013-05-19 DIAGNOSIS — R079 Chest pain, unspecified: Secondary | ICD-10-CM
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
## 2013-05-19 NOTE — Patient Instructions (Signed)
Your physician recommends that you schedule a follow-up appointment in: 1 year with Dr. Branch. You should receive a letter in the mail in 10 months. If you do not receive this letter by January 2016 call our office to schedule this appointment.   Your physician recommends that you continue on your current medications as directed. Please refer to the Current Medication list given to you today.   

## 2014-02-04 ENCOUNTER — Encounter (HOSPITAL_COMMUNITY): Payer: Self-pay | Admitting: Cardiovascular Disease

## 2014-06-27 DIAGNOSIS — J358 Other chronic diseases of tonsils and adenoids: Secondary | ICD-10-CM

## 2014-06-27 DIAGNOSIS — J039 Acute tonsillitis, unspecified: Secondary | ICD-10-CM

## 2014-06-27 HISTORY — DX: Other chronic diseases of tonsils and adenoids: J35.8

## 2014-06-27 HISTORY — DX: Acute tonsillitis, unspecified: J03.90

## 2014-07-09 ENCOUNTER — Encounter (HOSPITAL_BASED_OUTPATIENT_CLINIC_OR_DEPARTMENT_OTHER): Payer: Self-pay | Admitting: *Deleted

## 2014-07-09 ENCOUNTER — Ambulatory Visit: Payer: Self-pay | Admitting: Otolaryngology

## 2014-07-09 NOTE — H&P (Signed)
  Assessment  Acute tonsillitis, unspecified etiology (463) (J03.90). Tonsillar calculus (474.8) (J35.8). Discussed  Acute tonsillitis, recurring tonsillitis with recurrent tonsil stones and bad breath. Consider tonsillectomy. Risks and benefits discussed. We discussed that there is possibility that he still had a bad breath afterwards and could still have sore throats. Reason For Visit  Tonsil stones. HPI  Chronic tonsil stones for several years, recurring sore throats and bad breath. Severe sore throat past 2 days. Strep negative, treated with IM antibiotics in the emergency department 2 days ago. Slightly better today. Allergies  No Known Drug Allergies. Current Meds  Aspirin EC 81 MG Oral Tablet Delayed Release;; RPT Norvasc TABS (AmLODIPine Besylate);; RPT. Family Hx  Family history of hypertension: Father (V17.49) (Z82.49) No pertinent family history: Mother. Personal Hx  Caffeine use (V49.89) (F15.90); 4 cups daily Never smoker No alcohol use Non-smoker (V49.89) (Z78.9). ROS  Systemic: Feeling tired (fatigue), fever, and night sweats.  No recent weight loss. Head: Headache. Eyes: No eye symptoms. Otolaryngeal: No hearing loss, no earache, no tinnitus, and no purulent nasal discharge.  No nasal passage blockage (stuffiness), no snoring, no sneezing, and no hoarseness.  Sore throat. Cardiovascular: No chest pain or discomfort  and no palpitations. Pulmonary: Dyspnea.  No cough  and no wheezing. Gastrointestinal: Dysphagia.  No heartburn.  No nausea, no abdominal pain, and no melena.  No diarrhea. Genitourinary: No dysuria. Endocrine: No muscle weakness. Musculoskeletal: No calf muscle cramps  and no arthralgias.  Soft tissue swelling. Neurological: No dizziness, no fainting, no tingling, and no numbness. Psychological: No anxiety  and no depression. Skin: No rash. 12 system ROS was obtained and reviewed on the Health Maintenance form dated today.  Positive responses are shown  above.  If the symptom is not checked, the patient has denied it. Vital Signs   Recorded by [facility] Inckolimowski,Sharon on 30 Jun 2014 09:05 AM BP:120/80,  Height: 5 ft 9.5 in, Weight: 195 lb , BMI: 28.4 kg/m2,  BMI Calculated: 28.38 ,  BSA Calculated: 2.05. Physical Exam  APPEARANCE: Well developed, well nourished, in no acute distress.  Normal affect, in a pleasant mood.  Oriented to time, place and person. COMMUNICATION: Normal voice   HEAD & FACE:  No scars, lesions or masses of head and face.  Sinuses nontender to palpation.  Salivary glands without mass or tenderness.  Facial strength symmetric.  No facial lesion, scars, or mass. EYES: EOMI with normal primary gaze alignment. Visual acuity grossly intact.  PERRLA EXTERNAL EAR & NOSE: No scars, lesions or masses  EAC & TYMPANIC MEMBRANE:  EAC shows no obstructing lesions or debris and tympanic membranes are normal bilaterally with good movement to insufflation. GROSS HEARING: Normal   TMJ:  Nontender  INTRANASAL EXAM: No polyps or purulence.  NASOPHARYNX: Normal, without lesions. LIPS, TEETH & GUMS: No lip lesions, normal dentition and normal gums. ORAL CAVITY/OROPHARYNX:  Oral mucosa moist without lesion or asymmetry of the palate, tongue, tonsil or posterior pharynx. Bilateral tonsillar enlargement with a white exudate, worse on the right. NECK:  Supple without adenopathy or mass. THYROID:  Normal with no masses palpable.  NEUROLOGIC:  No gross CN deficits. No nystagmus noted.   LYMPHATIC: Slightly tender level II adenopathy bilaterally. Signature  Electronically signed by : Serena ColonelJefry  Jahad Old  M.D.; 06/30/2014 9:16 AM EST.

## 2014-07-09 NOTE — Pre-Procedure Instructions (Signed)
To come for BMET and EKG 

## 2014-07-12 ENCOUNTER — Encounter (HOSPITAL_BASED_OUTPATIENT_CLINIC_OR_DEPARTMENT_OTHER)
Admission: RE | Admit: 2014-07-12 | Discharge: 2014-07-12 | Disposition: A | Discharge status: Home / Self Care | Payer: 59 | Source: Ambulatory Visit | Attending: Otolaryngology | Admitting: Otolaryngology

## 2014-07-12 DIAGNOSIS — R001 Bradycardia, unspecified: Secondary | ICD-10-CM | POA: Diagnosis not present

## 2014-07-12 DIAGNOSIS — Z01810 Encounter for preprocedural cardiovascular examination: Secondary | ICD-10-CM | POA: Diagnosis not present

## 2014-07-12 DIAGNOSIS — Z01812 Encounter for preprocedural laboratory examination: Secondary | ICD-10-CM | POA: Diagnosis present

## 2014-07-12 LAB — BASIC METABOLIC PANEL
ANION GAP: 7 (ref 5–15)
BUN: 14 mg/dL (ref 6–20)
CHLORIDE: 104 mmol/L (ref 101–111)
CO2: 30 mmol/L (ref 22–32)
CREATININE: 1.24 mg/dL (ref 0.61–1.24)
Calcium: 9.4 mg/dL (ref 8.9–10.3)
GFR calc non Af Amer: >60 mL/min (ref >60)
Glucose, Bld: 106 mg/dL — ABNORMAL HIGH (ref 65–99)
Potassium: 4 mmol/L (ref 3.5–5.1)
SODIUM: 141 mmol/L (ref 135–145)

## 2014-07-16 SURGERY — TONSILLECTOMY
Anesthesia: General | Laterality: Bilateral

## 2014-09-03 ENCOUNTER — Ambulatory Visit (HOSPITAL_BASED_OUTPATIENT_CLINIC_OR_DEPARTMENT_OTHER): Admission: RE | Admit: 2014-09-03 | Payer: 59 | Source: Ambulatory Visit | Admitting: Otolaryngology

## 2014-09-03 HISTORY — DX: Other specified postprocedural states: Z98.890

## 2014-09-03 HISTORY — DX: Acute tonsillitis, unspecified: J03.90

## 2014-09-03 HISTORY — DX: Other chronic diseases of tonsils and adenoids: J35.8

## 2015-01-25 ENCOUNTER — Encounter (HOSPITAL_COMMUNITY): Payer: Self-pay | Admitting: Emergency Medicine

## 2015-01-25 ENCOUNTER — Emergency Department (HOSPITAL_COMMUNITY): Payer: 59

## 2015-01-25 ENCOUNTER — Emergency Department (HOSPITAL_COMMUNITY)
Admission: EM | Admit: 2015-01-25 | Discharge: 2015-01-25 | Disposition: A | Discharge status: Home / Self Care | Payer: 59 | Attending: Emergency Medicine | Admitting: Emergency Medicine

## 2015-01-25 DIAGNOSIS — Z9889 Other specified postprocedural states: Secondary | ICD-10-CM | POA: Insufficient documentation

## 2015-01-25 DIAGNOSIS — Z8709 Personal history of other diseases of the respiratory system: Secondary | ICD-10-CM | POA: Insufficient documentation

## 2015-01-25 DIAGNOSIS — Z79899 Other long term (current) drug therapy: Secondary | ICD-10-CM | POA: Insufficient documentation

## 2015-01-25 DIAGNOSIS — Z7982 Long term (current) use of aspirin: Secondary | ICD-10-CM | POA: Insufficient documentation

## 2015-01-25 DIAGNOSIS — I10 Essential (primary) hypertension: Secondary | ICD-10-CM | POA: Insufficient documentation

## 2015-01-25 DIAGNOSIS — M5432 Sciatica, left side: Secondary | ICD-10-CM | POA: Insufficient documentation

## 2015-01-25 MED ORDER — NAPROXEN 500 MG PO TABS: 500.0000 mg | ORAL_TABLET | Freq: Two times a day (BID) | ORAL | Status: DC

## 2015-01-25 NOTE — ED Provider Notes (Signed)
CSN: [id number]     Arrival date & time 01/25/15  1330 History   First MD Initiated Contact with Patient 01/25/15 1349     Chief Complaint  Patient presents with  . Leg Pain      HPI  Patient evaluation of leg pain. Feels like his leg is painful and tingling from his mid thigh down also into his buttock somewhat. Seen at another healthcare/urgent care center. Referred for ultrasound for concern of a blood clot. Patient does drive. 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 per day. Never had a blood clot. Had an MI 2 which he states was because of "hypertension".  Past Medical History  Diagnosis Date  . HTN (hypertension)     states under control with med., has been on med. x 3 yr.  . History of cardiac cath 2013    "evidence of coronary spasm"; no obstruction  . Tonsillar calculus 06/2014  . Acute tonsillitis 06/2014   Past Surgical History  Procedure Laterality Date  . Appendectomy    . Left heart catheterization with coronary angiogram N/A 05/18/2011    Procedure: LEFT HEART CATHETERIZATION WITH CORONARY ANGIOGRAM;  Surgeon: Vesta MixerPhilip J Nahser, MD;  [facility];  Service: Cardiovascular;  Laterality: N/A;   Family History  Problem Relation Age of Onset  . Stroke Mother   . Stroke Father   . Hypertension Father   . Coronary artery disease Father    Social History  Substance Use Topics  . Smoking status: Never Smoker   . Smokeless tobacco: Never Used  . Alcohol Use: No    Review of Systems  Constitutional: Negative for fever, chills, diaphoresis, appetite change and fatigue.  HENT: Negative for mouth sores, sore throat and trouble swallowing.   Eyes: Negative for visual disturbance.  Respiratory: Negative for cough, chest tightness, shortness of breath and wheezing.   Cardiovascular: Negative for chest pain.  Gastrointestinal: Negative for nausea, vomiting, abdominal pain, diarrhea and  abdominal distention.  Endocrine: Negative for polydipsia, polyphagia and polyuria.  Genitourinary: Negative for dysuria, frequency and hematuria.  Musculoskeletal: Negative for gait problem.       Left leg pain  Skin: Negative for color change, pallor and rash.  Neurological: Negative for dizziness, syncope, light-headedness and headaches.  Hematological: Does not bruise/bleed easily.  Psychiatric/Behavioral: Negative for behavioral problems and confusion.      Allergies  Review of patient's allergies indicates no known allergies.  Home Medications   Prior to Admission medications   Medication Sig Start Date End Date Taking? Authorizing Provider  aspirin EC 81 MG tablet Take 81 mg by mouth daily.   Yes Historical Provider, MD  amLODipine (NORVASC) 5 MG tablet Take 1 tablet (5 mg total) by mouth daily. 02/06/13   Antoine PocheJonathan F Branch, MD  naproxen (NAPROSYN) 500 MG tablet Take 1 tablet (500 mg total) by mouth 2 (two) times daily. 01/25/15   Rolland PorterMark Britt Petroni, MD  NITROSTAT 0.4 MG SL tablet Place 0.4 mg under the tongue every 5 (five) minutes as needed for chest pain.  02/02/13   Historical Provider, MD   BP 161/107 mmHg  Pulse 64  Temp(Src) 97.5 F (36.4 C) (Oral)  Resp 16  Ht 5\' 10"  (1.778 m)  Wt 198 lb (89.812 kg)  BMI 28.41 kg/m2  SpO2 98% Physical Exam  Constitutional: He is oriented to person, place, and time. He appears well-developed and well-nourished. No  distress.  HENT:  Head: Normocephalic.  Eyes: Conjunctivae are normal. Pupils are equal, round, and reactive to light. No scleral icterus.  Neck: Normal range of motion. Neck supple. No thyromegaly present.  Cardiovascular: Normal rate and regular rhythm.  Exam reveals no gallop and no friction rub.   No murmur heard. Pulmonary/Chest: Effort normal and breath sounds normal. No respiratory distress. He has no wheezes. He has no rales.  Abdominal: Soft. Bowel sounds are normal. He exhibits no distension. There is no tenderness.  There is no rebound.  Musculoskeletal: Normal range of motion.       Legs: Neurological: He is alert and oriented to person, place, and time.  Skin: Skin is warm and dry. No rash noted.  Psychiatric: He has a normal mood and affect. His behavior is normal.    ED Course  Procedures (including critical care time) Labs Review Labs Reviewed - No data to display  Imaging Review US Venous Img Lower Unilateral Left  01/25/2015  CLINICAL DATA:  34 year old male with left lower extremity pain radiating toward the back of the knee EXAM: LEFT LOWER EXTREMITY VENOUS DOPPLER ULTRASOUND TECHNIQUE: Gray-scale sonography with graded compression, as well as color Doppler and duplex ultrasound were performed to evaluate the lower extremity deep venous systems from the level of the common femoral vein and including the common femoral, femoral, profunda femoral, popliteal and calf veins including the posterior tibial, peroneal and gastrocnemius veins when visible. The superficial great saphenous vein was also interrogated. Spectral Doppler was utilized to evaluate flow at rest and with distal augmentation maneuvers in the common femoral, femoral and popliteal veins. COMPARISON:  None. FINDINGS: Contralateral Common Femoral Vein: Respiratory phasicity is normal and symmetric with the symptomatic side. No evidence of thrombus. Normal compressibility. Common Femoral Vein: No evidence of thrombus. Normal compressibility, respiratory phasicity and response to augmentation. Saphenofemoral Junction: No evidence of thrombus. Normal compressibility and flow on color Doppler imaging. Profunda Femoral Vein: No evidence of thrombus. Normal compressibility and flow on color Doppler imaging. Femoral Vein: No evidence of thrombus. Normal compressibility, respiratory phasicity and response to augmentation. Popliteal Vein: No evidence of thrombus. Normal compressibility, respiratory phasicity and response to augmentation. Calf Veins: No  evidence of thrombus. Normal compressibility and flow on color Doppler imaging. Superficial Great Saphenous Vein: No evidence of thrombus. Normal compressibility and flow on color Doppler imaging. Venous Reflux:  None. Other Findings:  None. IMPRESSION: No evidence of deep venous thrombosis. Electronically Signed   By: Malachy Moan M.D.   On: 01/25/2015 15:14   I have personally reviewed and evaluated these images and lab results as part of my medical decision-making.   EKG Interpretation None      MDM   Final diagnoses:  Sciatica of left side    ultrasound. clinically has a well perfused functional leg. plan is treatment for sciatica. precautions discussed.    [name], MD 01/25/15 1640

## 2015-01-25 NOTE — Discharge Instructions (Signed)
°  Sciatica °Sciatica is pain, weakness, numbness, or tingling along your sciatic nerve. The nerve starts in the lower back and runs down the back of each leg. Nerve damage or certain conditions pinch or put pressure on the sciatic nerve. This causes the pain, weakness, and other discomforts of sciatica. °HOME CARE  °· Only take medicine as told by your doctor. °· Apply ice to the affected area for 20 minutes. Do this 3-4 times a day for the first 48-72 hours. Then try heat in the same way. °· Exercise, stretch, or do your usual activities if these do not make your pain worse. °· Go to physical therapy as told by your doctor. °· Keep all doctor visits as told. °· Do not wear high heels or shoes that are not supportive. °· Get a firm mattress if your mattress is too soft to lessen pain and discomfort. °GET HELP RIGHT AWAY IF:  °· You cannot control when you poop (bowel movement) or pee (urinate). °· You have more weakness in your lower back, lower belly (pelvis), butt (buttocks), or legs. °· You have redness or puffiness (swelling) of your back. °· You have a burning feeling when you pee. °· You have pain that gets worse when you lie down. °· You have pain that wakes you from your sleep. °· Your pain is worse than past pain. °· Your pain lasts longer than 4 weeks. °· You are suddenly losing weight without reason. °MAKE SURE YOU:  °· Understand these instructions. °· Will watch this condition. °· Will get help right away if you are not doing well or get worse. °  °This information is not intended to replace advice given to you by your health care provider. Make sure you discuss any questions you have with your health care provider. °  °Document Released: 11/22/2007 Document Revised: 11/03/2014 Document Reviewed: 06/24/2011 °Elsevier Interactive Patient Education ©2016 [employer] ° ° °

## 2015-01-25 NOTE — ED Notes (Addendum)
Pt c/o LT upper leg pain that began last night. Pt reports pain radiates down to back of knee. No redness, edema, or warmth per patient. Denies recent injury/fall. Pt hx of MI and cardiac cath. Pt states he is not taking his blood thinners as prescribed. Pt drives a truck for work.

## 2015-01-25 NOTE — ED Notes (Signed)
Patient with no complaints at this time. Respirations even and unlabored. Skin warm/dry. Discharge instructions reviewed with patient at this time. Patient given opportunity to voice concerns/ask questions. Patient discharged at this time and left Emergency Department with steady gait.   

## 2015-08-20 ENCOUNTER — Encounter (HOSPITAL_COMMUNITY): Payer: Self-pay | Admitting: *Deleted

## 2015-08-20 ENCOUNTER — Emergency Department (HOSPITAL_COMMUNITY)
Admission: EM | Admit: 2015-08-20 | Discharge: 2015-08-20 | Disposition: A | Discharge status: Home / Self Care | Payer: 59 | Attending: Emergency Medicine | Admitting: Emergency Medicine

## 2015-08-20 DIAGNOSIS — R51 Headache: Secondary | ICD-10-CM | POA: Insufficient documentation

## 2015-08-20 DIAGNOSIS — H60502 Unspecified acute noninfective otitis externa, left ear: Secondary | ICD-10-CM | POA: Insufficient documentation

## 2015-08-20 DIAGNOSIS — I10 Essential (primary) hypertension: Secondary | ICD-10-CM | POA: Insufficient documentation

## 2015-08-20 DIAGNOSIS — H6092 Unspecified otitis externa, left ear: Secondary | ICD-10-CM

## 2015-08-20 DIAGNOSIS — Z7982 Long term (current) use of aspirin: Secondary | ICD-10-CM | POA: Insufficient documentation

## 2015-08-20 DIAGNOSIS — Z79899 Other long term (current) drug therapy: Secondary | ICD-10-CM | POA: Insufficient documentation

## 2015-08-20 MED ORDER — IBUPROFEN 800 MG PO TABS
800.0000 mg | ORAL_TABLET | Freq: Once | ORAL | Status: AC
  Administered 2015-08-20: 800 mg via ORAL
  Filled 2015-08-20: qty 1

## 2015-08-20 MED ORDER — HYDROCODONE-ACETAMINOPHEN 5-325 MG PO TABS: 1.0000 | ORAL_TABLET | ORAL | Status: DC | PRN

## 2015-08-20 MED ORDER — NEOMYCIN-POLYMYXIN-HC 1 % OT SOLN
4.0000 [drp] | Freq: Once | OTIC | Status: AC
  Administered 2015-08-20: 4 [drp] via OTIC
  Filled 2015-08-20: qty 10

## 2015-08-20 MED ORDER — CLINDAMYCIN HCL 150 MG PO CAPS
300.0000 mg | ORAL_CAPSULE | Freq: Once | ORAL | Status: AC
  Administered 2015-08-20: 300 mg via ORAL
  Filled 2015-08-20: qty 2

## 2015-08-20 MED ORDER — IBUPROFEN 600 MG PO TABS: 600.0000 mg | ORAL_TABLET | Freq: Four times a day (QID) | ORAL | Status: DC | PRN

## 2015-08-20 MED ORDER — CLINDAMYCIN HCL 300 MG PO CAPS: 300.0000 mg | ORAL_CAPSULE | Freq: Three times a day (TID) | ORAL | Status: DC

## 2015-08-20 NOTE — ED Provider Notes (Signed)
CSN: [id number]     Arrival date & time 08/20/15  1721 History   First MD Initiated Contact with Patient 08/20/15 1759     Chief Complaint  Patient presents with  . Otalgia     (Consider location/radiation/quality/duration/timing/severity/associated sxs/prior Treatment) Patient is a 35 y.o. male presenting with ear pain. The history is provided by the patient.  Otalgia Location:  Left Behind ear:  No abnormality Quality:  Throbbing and shooting Severity:  Moderate Onset quality:  Gradual Duration:  3 days Timing:  Intermittent Progression:  Worsening Chronicity:  New Context comment:  Q-tips to clean ear, and developed pain. Relieved by:  Nothing Worsened by:  Nothing tried Ineffective treatments:  None tried Associated symptoms: headaches   Associated symptoms: no fever, no rash, no tinnitus and no vomiting   Risk factors: no recent travel and no prior ear surgery     Past Medical History  Diagnosis Date  . HTN (hypertension)     states under control with med., has been on med. x 3 yr.  . History of cardiac cath 2013    "evidence of coronary spasm"; no obstruction  . Tonsillar calculus 06/2014  . Acute tonsillitis 06/2014   Past Surgical History  Procedure Laterality Date  . Appendectomy    . Left heart catheterization with coronary angiogram N/A 05/18/2011    Procedure: LEFT HEART CATHETERIZATION WITH CORONARY ANGIOGRAM;  Surgeon: Donald MixerPhilip J Nahser, MD;  [facility];  Service: Cardiovascular;  Laterality: N/A;   Family History  Problem Relation Age of Onset  . Stroke Mother   . Stroke Father   . Hypertension Father   . Coronary artery disease Father    Social History  Substance Use Topics  . Smoking status: Never Smoker   . Smokeless tobacco: Never Used  . Alcohol Use: No    Review of Systems  Constitutional: Negative for fever.  HENT: Positive for ear pain. Negative for tinnitus.   Gastrointestinal: Negative for vomiting.  Skin: Negative for  rash.  Neurological: Positive for headaches.  All other systems reviewed and are negative.     Allergies  Review of patient's allergies indicates no known allergies.  Home Medications   Prior to Admission medications   Medication Sig Start Date End Date Taking? Authorizing Provider  amLODipine (NORVASC) 5 MG tablet Take 1 tablet (5 mg total) by mouth daily. 02/06/13   Donald PocheJonathan F Branch, MD  aspirin EC 81 MG tablet Take 81 mg by mouth daily.    Historical Provider, MD  naproxen (NAPROSYN) 500 MG tablet Take 1 tablet (500 mg total) by mouth 2 (two) times daily. 01/25/15   Donald PorterMark James, MD  NITROSTAT 0.4 MG SL tablet Place 0.4 mg under the tongue every 5 (five) minutes as needed for chest pain.  02/02/13   Historical Provider, MD   BP 158/107 mmHg  Pulse 70  Temp(Src) 97.9 F (36.6 C) (Oral)  Resp 16  Ht 5\' 10"  (1.778 m)  Wt 90.719 kg  BMI 28.70 kg/m2  SpO2 99% Physical Exam  Constitutional: He is oriented to person, place, and time. He appears well-developed and well-nourished.  Non-toxic appearance.  HENT:  Head: Normocephalic.  Right Ear: Tympanic membrane and external ear normal.  Left Ear: Tympanic membrane normal. There is swelling and tenderness. No drainage. No foreign bodies. No mastoid tenderness. No decreased hearing is noted.  LeftEAC swollen with grey to yellow mucus present. Pain with ear movement. Can not visualize TM. No redness or swelling of  left mastoid. Mild red areas of the right EAC. No drainage. TM wnl. No mastoid redness or swelling.   Eyes: EOM and lids are normal. Pupils are equal, round, and reactive to light.  Neck: Normal range of motion. Neck supple. Carotid bruit is not present.  Cardiovascular: Normal rate, regular rhythm, normal heart sounds, intact distal pulses and normal pulses.   Pulmonary/Chest: Breath sounds normal. No respiratory distress.  Abdominal: Soft. Bowel sounds are normal. There is no tenderness. There is no guarding.   Musculoskeletal: Normal range of motion.  Lymphadenopathy:       Head (right side): No submandibular adenopathy present.       Head (left side): No submandibular adenopathy present.    He has no cervical adenopathy.  Neurological: He is alert and oriented to person, place, and time. He has normal strength. No cranial nerve deficit or sensory deficit.  Skin: Skin is warm and dry.  Psychiatric: He has a normal mood and affect. His speech is normal.  Nursing note and vitals reviewed.   ED Course  Procedures (including critical care time) Labs Review Labs Reviewed - No data to display  Imaging Review No results found. I have personally reviewed and evaluated these images and lab results as part of my medical decision-making.   EKG Interpretation None      MDM Exam favors acute otitis externa. Pt treated with cortisporin, clindamycin, and norco. Pt to have follow up/recheck in 1 week by primary MD Pt in agreement with d/c plan.   Final diagnoses:  None    **I have reviewed nursing notes, vital signs, and all appropriate lab and imaging results for this patient.Donald Mcgrath*    Donald Coulibaly, PA-C 08/20/15 1810  Donald HongBrian Miller, MD 08/21/15 1539

## 2015-08-20 NOTE — ED Notes (Signed)
Pt states started having a sharp pain and deceased hearing in his left ear.

## 2015-08-20 NOTE — Discharge Instructions (Signed)
Please stop using q-tips. Do not put anything in you ear except your wash cloth.  Otitis Externa Otitis externa is a bacterial or fungal infection of the outer ear canal. This is the area from the eardrum to the outside of the ear. Otitis externa is sometimes called "swimmer's ear." CAUSES  Possible causes of infection include:  Swimming in dirty water.  Moisture remaining in the ear after swimming or bathing.  Mild injury (trauma) to the ear.  Objects stuck in the ear (foreign body).  Cuts or scrapes (abrasions) on the outside of the ear. SIGNS AND SYMPTOMS  The first symptom of infection is often itching in the ear canal. Later signs and symptoms may include swelling and redness of the ear canal, ear pain, and yellowish-white fluid (pus) coming from the ear. The ear pain may be worse when pulling on the earlobe. DIAGNOSIS  Your health care provider will perform a physical exam. A sample of fluid may be taken from the ear and examined for bacteria or fungi. TREATMENT  Antibiotic ear drops are often given for 10 to 14 days. Treatment may also include pain medicine or corticosteroids to reduce itching and swelling. HOME CARE INSTRUCTIONS   Apply antibiotic ear drops to the ear canal as prescribed by your health care provider.  Take medicines only as directed by your health care provider.  If you have diabetes, follow any additional treatment instructions from your health care provider.  Keep all follow-up visits as directed by your health care provider. PREVENTION   Keep your ear dry. Use the corner of a towel to absorb water out of the ear canal after swimming or bathing.  Avoid scratching or putting objects inside your ear. This can damage the ear canal or remove the protective wax that lines the canal. This makes it easier for bacteria and fungi to grow.  Avoid swimming in lakes, polluted water, or poorly chlorinated pools.  You may use ear drops made of rubbing alcohol and  vinegar after swimming. Combine equal parts of white vinegar and alcohol in a bottle. Put 3 or 4 drops into each ear after swimming. SEEK MEDICAL CARE IF:   You have a fever.  Your ear is still red, swollen, painful, or draining pus after 3 days.  Your redness, swelling, or pain gets worse.  You have a severe headache.  You have redness, swelling, pain, or tenderness in the area behind your ear. MAKE SURE YOU:   Understand these instructions.  Will watch your condition.  Will get help right away if you are not doing well or get worse.   This information is not intended to replace advice given to you by your health care provider. Make sure you discuss any questions you have with your health care provider.   Document Released: 02/12/2005 Document Revised: 03/05/2014 Document Reviewed: 03/01/2011 [employer] Interactive Patient Education 2016 [employer]  Use 4 drops of cortisporin to the left ear three times daily for 7 days. Use ibuprofen every 6 hours for inflammation and pain. Use cleocin three times daily with food. Use norco for severe pain.This medication may cause drowsiness. Please do not drink, drive, or participate in activity that requires concentration while taking this medication. Call Dr Sherryll BurgerShah on Monday for appointment on Friday or next Monday to recheck the ear infection. Your blood pressure is elevated. Please have this rechecked by your MD to prevent serious problem.

## 2015-08-23 ENCOUNTER — Emergency Department (HOSPITAL_COMMUNITY): Payer: Medicaid Other

## 2015-08-23 ENCOUNTER — Encounter (HOSPITAL_COMMUNITY): Payer: Self-pay

## 2015-08-23 ENCOUNTER — Emergency Department (HOSPITAL_COMMUNITY)
Admission: EM | Admit: 2015-08-23 | Discharge: 2015-08-23 | Disposition: A | Discharge status: Home / Self Care | Payer: Medicaid Other | Attending: Emergency Medicine | Admitting: Emergency Medicine

## 2015-08-23 DIAGNOSIS — Y999 Unspecified external cause status: Secondary | ICD-10-CM | POA: Insufficient documentation

## 2015-08-23 DIAGNOSIS — S0990XA Unspecified injury of head, initial encounter: Secondary | ICD-10-CM | POA: Diagnosis not present

## 2015-08-23 DIAGNOSIS — S0181XA Laceration without foreign body of other part of head, initial encounter: Secondary | ICD-10-CM | POA: Diagnosis not present

## 2015-08-23 DIAGNOSIS — S52122A Displaced fracture of head of left radius, initial encounter for closed fracture: Secondary | ICD-10-CM

## 2015-08-23 DIAGNOSIS — S52121A Displaced fracture of head of right radius, initial encounter for closed fracture: Secondary | ICD-10-CM | POA: Diagnosis not present

## 2015-08-23 DIAGNOSIS — R791 Abnormal coagulation profile: Secondary | ICD-10-CM | POA: Insufficient documentation

## 2015-08-23 DIAGNOSIS — I10 Essential (primary) hypertension: Secondary | ICD-10-CM | POA: Diagnosis not present

## 2015-08-23 DIAGNOSIS — Z7982 Long term (current) use of aspirin: Secondary | ICD-10-CM | POA: Diagnosis not present

## 2015-08-23 DIAGNOSIS — S59902A Unspecified injury of left elbow, initial encounter: Secondary | ICD-10-CM | POA: Diagnosis present

## 2015-08-23 DIAGNOSIS — Y939 Activity, unspecified: Secondary | ICD-10-CM | POA: Insufficient documentation

## 2015-08-23 DIAGNOSIS — Y92410 Unspecified street and highway as the place of occurrence of the external cause: Secondary | ICD-10-CM | POA: Diagnosis not present

## 2015-08-23 LAB — COMPREHENSIVE METABOLIC PANEL
ALT: 30 U/L (ref 17–63)
AST: 31 U/L (ref 15–41)
Albumin: 3.9 g/dL (ref 3.5–5.0)
Alkaline Phosphatase: 56 U/L (ref 38–126)
Anion gap: 8 (ref 5–15)
BUN: 29 mg/dL — AB (ref 6–20)
CHLORIDE: 107 mmol/L (ref 101–111)
CO2: 23 mmol/L (ref 22–32)
Calcium: 9.1 mg/dL (ref 8.9–10.3)
Creatinine, Ser: 1.39 mg/dL — ABNORMAL HIGH (ref 0.61–1.24)
Glucose, Bld: 121 mg/dL — ABNORMAL HIGH (ref 65–99)
POTASSIUM: 3.6 mmol/L (ref 3.5–5.1)
Sodium: 138 mmol/L (ref 135–145)
Total Bilirubin: 0.4 mg/dL (ref 0.3–1.2)
Total Protein: 6.4 g/dL — ABNORMAL LOW (ref 6.5–8.1)

## 2015-08-23 LAB — URINE MICROSCOPIC-ADD ON

## 2015-08-23 LAB — I-STAT CHEM 8, ED
BUN: 37 mg/dL — ABNORMAL HIGH (ref 6–20)
CREATININE: 1.3 mg/dL — AB (ref 0.61–1.24)
Calcium, Ion: 1.11 mmol/L — ABNORMAL LOW (ref 1.13–1.30)
Chloride: 105 mmol/L (ref 101–111)
Glucose, Bld: 119 mg/dL — ABNORMAL HIGH (ref 65–99)
HEMATOCRIT: 47 % (ref 39.0–52.0)
HEMOGLOBIN: 16 g/dL (ref 13.0–17.0)
Potassium: 3.9 mmol/L (ref 3.5–5.1)
Sodium: 140 mmol/L (ref 135–145)
TCO2: 25 mmol/L (ref 0–100)

## 2015-08-23 LAB — CBC
HEMATOCRIT: 45.3 % (ref 39.0–52.0)
Hemoglobin: 14.5 g/dL (ref 13.0–17.0)
MCH: 27.3 pg (ref 26.0–34.0)
MCHC: 32 g/dL (ref 30.0–36.0)
MCV: 85.2 fL (ref 78.0–100.0)
Platelets: 245 10*3/uL (ref 150–400)
RBC: 5.32 MIL/uL (ref 4.22–5.81)
RDW: 13.4 % (ref 11.5–15.5)
WBC: 13 10*3/uL — AB (ref 4.0–10.5)

## 2015-08-23 LAB — URINALYSIS, ROUTINE W REFLEX MICROSCOPIC
Bilirubin Urine: NEGATIVE
Glucose, UA: 100 mg/dL — AB
Ketones, ur: 15 mg/dL — AB
LEUKOCYTES UA: NEGATIVE
NITRITE: NEGATIVE
PH: 5.5 (ref 5.0–8.0)
Protein, ur: NEGATIVE mg/dL
SPECIFIC GRAVITY, URINE: 1.031 — AB (ref 1.005–1.030)

## 2015-08-23 LAB — SAMPLE TO BLOOD BANK

## 2015-08-23 LAB — ETHANOL: Alcohol, Ethyl (B): <5 mg/dL (ref <5)

## 2015-08-23 LAB — PROTIME-INR
INR: 1.15 (ref 0.00–1.49)
Prothrombin Time: 14.9 seconds (ref 11.6–15.2)

## 2015-08-23 MED ORDER — CEPHALEXIN 500 MG PO CAPS: 500.0000 mg | ORAL_CAPSULE | Freq: Four times a day (QID) | ORAL | Status: DC

## 2015-08-23 MED ORDER — TETANUS-DIPHTH-ACELL PERTUSSIS 5-2.5-18.5 LF-MCG/0.5 IM SUSP
0.5000 mL | Freq: Once | INTRAMUSCULAR | Status: AC
  Administered 2015-08-23: 0.5 mL via INTRAMUSCULAR
  Filled 2015-08-23: qty 0.5

## 2015-08-23 MED ORDER — SODIUM CHLORIDE 0.9 % IV BOLUS (SEPSIS)
1000.0000 mL | Freq: Once | INTRAVENOUS | Status: AC
  Administered 2015-08-23: 1000 mL via INTRAVENOUS

## 2015-08-23 MED ORDER — OXYCODONE-ACETAMINOPHEN 5-325 MG PO TABS: 1.0000 | ORAL_TABLET | Freq: Four times a day (QID) | ORAL | Status: DC | PRN

## 2015-08-23 MED ORDER — TETANUS-DIPHTHERIA TOXOIDS TD 5-2 LFU IM INJ: 0.5000 mL | INJECTION | Freq: Once | INTRAMUSCULAR | Status: DC

## 2015-08-23 MED ORDER — DOCUSATE SODIUM 100 MG PO CAPS
100.0000 mg | ORAL_CAPSULE | Freq: Two times a day (BID) | ORAL | Status: DC
Start: 2015-08-23 — End: 2016-07-11

## 2015-08-23 MED ORDER — TRANEXAMIC ACID 1000 MG/10ML IV SOLN
500.0000 mg | Freq: Once | INTRAVENOUS | Status: AC
  Administered 2015-08-23: 500 mg via TOPICAL
  Filled 2015-08-23: qty 10

## 2015-08-23 MED ORDER — HYDROMORPHONE HCL 1 MG/ML IJ SOLN
1.0000 mg | Freq: Once | INTRAMUSCULAR | Status: AC
  Administered 2015-08-23: 1 mg via INTRAVENOUS
  Filled 2015-08-23: qty 1

## 2015-08-23 MED ORDER — ONDANSETRON 4 MG PO TBDP: 4.0000 mg | ORAL_TABLET | Freq: Three times a day (TID) | ORAL | Status: DC | PRN

## 2015-08-23 MED ORDER — OXYCODONE-ACETAMINOPHEN 5-325 MG PO TABS
2.0000 | ORAL_TABLET | Freq: Once | ORAL | Status: AC
  Administered 2015-08-23: 2 via ORAL
  Filled 2015-08-23: qty 2

## 2015-08-23 MED ORDER — LIDOCAINE HCL (PF) 1 % IJ SOLN
30.0000 mL | Freq: Once | INTRAMUSCULAR | Status: DC
  Filled 2015-08-23: qty 30

## 2015-08-23 MED ORDER — FENTANYL CITRATE (PF) 100 MCG/2ML IJ SOLN
50.0000 ug | Freq: Once | INTRAMUSCULAR | Status: AC
  Administered 2015-08-23: 50 ug via INTRAVENOUS
  Filled 2015-08-23: qty 2

## 2015-08-23 MED ORDER — LIDOCAINE HCL (PF) 1 % IJ SOLN
30.0000 mL | Freq: Once | INTRAMUSCULAR | Status: AC
  Administered 2015-08-23: 30 mL
  Filled 2015-08-23: qty 30

## 2015-08-23 MED ORDER — SODIUM CHLORIDE 0.9 % IV SOLN: INTRAVENOUS | Status: DC

## 2015-08-23 MED ORDER — CEPHALEXIN 250 MG PO CAPS
500.0000 mg | ORAL_CAPSULE | Freq: Once | ORAL | Status: AC
  Administered 2015-08-23: 500 mg via ORAL
  Filled 2015-08-23: qty 2

## 2015-08-23 NOTE — ED Provider Notes (Signed)
LACERATION REPAIR Performed by: Garlon HatchetSANDERS, LISA M Authorized by: Garlon HatchetSANDERS, LISA M Consent: Verbal consent obtained. Risks and benefits: risks, benefits and alternatives were discussed Consent given by: patient Patient identity confirmed: provided demographic data Prepped and Draped in normal sterile fashion Wound explored  Laceration Location: right cheek, right eyebrow, right forehead  Laceration Length: 10 cm to right cheek, other small lacerations 1-2 cm (right eyebrow, right forehead)   No Foreign Bodies seen or palpated  Anesthesia: local infiltration  Local anesthetic: lidocaine 1% without epinephrine  Anesthetic total: 3 ml  Irrigation method: syringe Amount of cleaning: standard  Skin closure: 6-0 prolene  Number of sutures: 7  Technique: simple interrupted (3 right eyelid, 1 eyebrow, 1 right forehead, 2 central forehead)  Patient tolerance: Patient tolerated the procedure well with no immediate complications.   Garlon HatchetLisa M Sanders, PA-C 08/23/15 [id number]

## 2015-08-23 NOTE — ED Provider Notes (Signed)
Laceration to face repaired at the request of Dr. Elesa MassedWard who is the primary care giver for this patient.   LACERATION REPAIR Performed by: Dorthula MatasGREENE,Emmer Lillibridge G Authorized by: Dorthula MatasGREENE,Shearon Clonch G Consent: Verbal consent obtained. Risks and benefits: risks, benefits and alternatives were discussed Consent given by: patient Patient identity confirmed: provided demographic data Prepped and Draped in normal sterile fashion Wound explored  Laceration Location: right facial laceration- complex  Laceration Length: 10 total inches of multiple lacerations  No Foreign Bodies seen or palpated  Anesthesia: local infiltration  Local anesthetic: lidocaine 1% wo epinephrine  Anesthetic total: 20 ml  Irrigation method: syringe Amount of cleaning: standard  Skin closure: Sutures Vicryl Rapide and Prolene  Number of sutures: 30  Technique: simple interrupted- external (22 sutures) and simple interrupted - internal (8 sutures)  Patient tolerance: Patient tolerated the procedure well with no immediate complications.  Complex laceration that had a venous bleed. Tranexamic acid was injected into the wound to control bleeding and then large amount of clots were removed.  Patient seen by my attending physician Dr. Elesa MassedWard, please refer to her note for HPI, ROS, PE, work-up, diagnosis, dispo/plan/ referrals.  Marlon Peliffany Amarri Michaelson, PA-C 08/23/15 [id number]  Marlon Peliffany Mitchell Epling, PA-C 08/23/15 [phone]
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
## 2015-08-23 NOTE — ED Notes (Signed)
Pt verbalized understanding of d/c instructions, prescriptions, and follow-up care. No further questions/concerns, VSS, ambulatory w/ steady gait (refused wheelchair) 
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
## 2015-08-23 NOTE — Progress Notes (Signed)
Orthopedic Tech Progress Note Patient Details:  Donald Mcgrath 06-24-1980 [id number]  Ortho Devices Type of Ortho Device: Post (long arm) splint Ortho Device/Splint Location: lue Ortho Device/Splint Interventions: Ordered, Application   Trinna PostMartinez, [name] 08/23/2015, 3:48 AM

## 2015-08-23 NOTE — Progress Notes (Signed)
Orthopedic Tech Progress Note Patient Details:  Wadie LessenBilly J Nahar 1980-12-31 [id number]  Ortho Devices Type of Ortho Device: Arm sling Ortho Device/Splint Location: lue Ortho Device/Splint Interventions: Ordered, Application   Trinna PostMartinez, Maxi Carreras J 08/23/2015, 2:16 AM

## 2015-08-23 NOTE — ED Notes (Signed)
Pt requesting soda to drink; this tech provided pt with soda to drink per Nehemiah SettleBrooke, RCharity fundraiser
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
## 2015-08-23 NOTE — ED Notes (Signed)
Tiffany at bedside for suturing.

## 2015-08-24 ENCOUNTER — Other Ambulatory Visit (HOSPITAL_COMMUNITY): Payer: Self-pay | Admitting: Orthopedic Surgery

## 2015-08-24 DIAGNOSIS — S5322XA Traumatic rupture of left radial collateral ligament, initial encounter: Secondary | ICD-10-CM

## 2015-08-24 DIAGNOSIS — S52122A Displaced fracture of head of left radius, initial encounter for closed fracture: Secondary | ICD-10-CM

## 2015-08-25 ENCOUNTER — Ambulatory Visit (HOSPITAL_COMMUNITY)
Admission: RE | Admit: 2015-08-25 | Discharge: 2015-08-25 | Disposition: A | Discharge status: Home / Self Care | Payer: No Typology Code available for payment source | Source: Ambulatory Visit | Attending: Orthopedic Surgery | Admitting: Orthopedic Surgery

## 2015-08-26 ENCOUNTER — Ambulatory Visit (HOSPITAL_COMMUNITY)
Admission: RE | Admit: 2015-08-26 | Discharge: 2015-08-26 | Disposition: A | Discharge status: Home / Self Care | Payer: Self-pay | Source: Ambulatory Visit | Attending: Orthopedic Surgery | Admitting: Orthopedic Surgery

## 2015-08-26 ENCOUNTER — Other Ambulatory Visit (HOSPITAL_COMMUNITY): Payer: Self-pay | Admitting: Orthopedic Surgery

## 2015-08-26 DIAGNOSIS — S52122A Displaced fracture of head of left radius, initial encounter for closed fracture: Secondary | ICD-10-CM

## 2015-08-26 DIAGNOSIS — S5322XA Traumatic rupture of left radial collateral ligament, initial encounter: Secondary | ICD-10-CM

## 2015-08-29 ENCOUNTER — Ambulatory Visit (HOSPITAL_COMMUNITY): Admission: RE | Admit: 2015-08-29 | Payer: Self-pay | Source: Ambulatory Visit

## 2015-08-31 ENCOUNTER — Other Ambulatory Visit (HOSPITAL_COMMUNITY): Payer: Self-pay | Admitting: Orthopedic Surgery

## 2015-08-31 DIAGNOSIS — S52122A Displaced fracture of head of left radius, initial encounter for closed fracture: Secondary | ICD-10-CM

## 2015-08-31 DIAGNOSIS — S5322XA Traumatic rupture of left radial collateral ligament, initial encounter: Secondary | ICD-10-CM

## 2015-09-12 ENCOUNTER — Ambulatory Visit (HOSPITAL_COMMUNITY)
Admission: RE | Admit: 2015-09-12 | Discharge: 2015-09-12 | Disposition: A | Discharge status: Home / Self Care | Payer: Medicaid Other | Source: Ambulatory Visit | Attending: Orthopedic Surgery | Admitting: Orthopedic Surgery

## 2015-09-12 DIAGNOSIS — X58XXXA Exposure to other specified factors, initial encounter: Secondary | ICD-10-CM | POA: Diagnosis not present

## 2015-09-12 DIAGNOSIS — S5322XA Traumatic rupture of left radial collateral ligament, initial encounter: Secondary | ICD-10-CM | POA: Diagnosis present

## 2015-09-12 DIAGNOSIS — S52122A Displaced fracture of head of left radius, initial encounter for closed fracture: Secondary | ICD-10-CM | POA: Diagnosis present

## 2015-09-12 MED ORDER — LIDOCAINE HCL 1 % IJ SOLN
INTRAMUSCULAR | Status: AC
  Filled 2015-09-12: qty 10

## 2015-09-12 MED ORDER — GADOBENATE DIMEGLUMINE 529 MG/ML IV SOLN
5.0000 mL | Freq: Once | INTRAVENOUS | Status: AC | PRN
  Administered 2015-09-12: 0.05 mL via INTRA_ARTICULAR

## 2015-09-12 MED ORDER — LIDOCAINE HCL (PF) 1 % IJ SOLN
10.0000 mL | Freq: Once | INTRAMUSCULAR | Status: AC
  Administered 2015-09-12: 16.5 mL via INTRADERMAL

## 2015-09-12 MED ORDER — IOPAMIDOL (ISOVUE-M 200) INJECTION 41%
INTRAMUSCULAR | Status: AC
  Administered 2015-09-12: 7.5 mL via INTRA_ARTICULAR
  Filled 2015-09-12: qty 10

## 2015-09-12 MED ORDER — IOPAMIDOL (ISOVUE-M 200) INJECTION 41%
20.0000 mL | Freq: Once | INTRAMUSCULAR | Status: AC
  Administered 2015-09-12: 7.5 mL via INTRA_ARTICULAR

## 2015-10-29 ENCOUNTER — Emergency Department (HOSPITAL_COMMUNITY): Payer: Self-pay

## 2015-10-29 ENCOUNTER — Encounter (HOSPITAL_COMMUNITY): Payer: Self-pay | Admitting: Emergency Medicine

## 2015-10-29 ENCOUNTER — Emergency Department (HOSPITAL_COMMUNITY)
Admission: EM | Admit: 2015-10-29 | Discharge: 2015-10-29 | Disposition: A | Discharge status: Home / Self Care | Payer: Self-pay | Attending: Emergency Medicine | Admitting: Emergency Medicine

## 2015-10-29 DIAGNOSIS — Y929 Unspecified place or not applicable: Secondary | ICD-10-CM | POA: Insufficient documentation

## 2015-10-29 DIAGNOSIS — Z79899 Other long term (current) drug therapy: Secondary | ICD-10-CM | POA: Insufficient documentation

## 2015-10-29 DIAGNOSIS — Y9389 Activity, other specified: Secondary | ICD-10-CM | POA: Insufficient documentation

## 2015-10-29 DIAGNOSIS — S61411A Laceration without foreign body of right hand, initial encounter: Secondary | ICD-10-CM | POA: Insufficient documentation

## 2015-10-29 DIAGNOSIS — Y999 Unspecified external cause status: Secondary | ICD-10-CM | POA: Insufficient documentation

## 2015-10-29 DIAGNOSIS — I10 Essential (primary) hypertension: Secondary | ICD-10-CM | POA: Insufficient documentation

## 2015-10-29 DIAGNOSIS — W268XXA Contact with other sharp object(s), not elsewhere classified, initial encounter: Secondary | ICD-10-CM | POA: Insufficient documentation

## 2015-10-29 DIAGNOSIS — Z7982 Long term (current) use of aspirin: Secondary | ICD-10-CM | POA: Insufficient documentation

## 2015-10-29 MED ORDER — IBUPROFEN 600 MG PO TABS: 600.0000 mg | ORAL_TABLET | Freq: Four times a day (QID) | ORAL | 0 refills | Status: DC | PRN

## 2015-10-29 MED ORDER — CEPHALEXIN 500 MG PO CAPS: 500.0000 mg | ORAL_CAPSULE | Freq: Four times a day (QID) | ORAL | 0 refills | Status: DC

## 2015-10-29 MED ORDER — LIDOCAINE HCL (PF) 1 % IJ SOLN
5.0000 mL | Freq: Once | INTRAMUSCULAR | Status: AC
  Administered 2015-10-29: 5 mL

## 2015-10-29 MED ORDER — LIDOCAINE HCL (PF) 1 % IJ SOLN
INTRAMUSCULAR | Status: AC
  Filled 2015-10-29: qty 5
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
## 2015-10-29 NOTE — ED Notes (Signed)
Patient transported to X-ray 

## 2015-10-29 NOTE — Discharge Instructions (Signed)
Have your sutures removed in 10 days.  Keep your wound clean and dry,  Until a good scab forms - you may then wash gently twice daily with mild soap and water, but dry completely after.  Get rechecked for any sign of infection (redness,  Swelling,  Increased pain or drainage of purulent fluid). ° °

## 2016-07-11 ENCOUNTER — Emergency Department (HOSPITAL_COMMUNITY): Payer: Medicaid Other

## 2016-07-11 ENCOUNTER — Encounter (HOSPITAL_COMMUNITY): Payer: Self-pay | Admitting: Emergency Medicine

## 2016-07-11 ENCOUNTER — Emergency Department (HOSPITAL_COMMUNITY)
Admission: EM | Admit: 2016-07-11 | Discharge: 2016-07-11 | Disposition: A | Discharge status: Home / Self Care | Payer: Medicaid Other | Attending: Emergency Medicine | Admitting: Emergency Medicine

## 2016-07-11 DIAGNOSIS — R079 Chest pain, unspecified: Secondary | ICD-10-CM | POA: Insufficient documentation

## 2016-07-11 DIAGNOSIS — Z7982 Long term (current) use of aspirin: Secondary | ICD-10-CM | POA: Insufficient documentation

## 2016-07-11 DIAGNOSIS — I10 Essential (primary) hypertension: Secondary | ICD-10-CM | POA: Insufficient documentation

## 2016-07-11 DIAGNOSIS — Z79899 Other long term (current) drug therapy: Secondary | ICD-10-CM | POA: Insufficient documentation

## 2016-07-11 HISTORY — DX: Old myocardial infarction: I25.2

## 2016-07-11 LAB — CBC
HEMATOCRIT: 46.5 % (ref 39.0–52.0)
HEMOGLOBIN: 15.6 g/dL (ref 13.0–17.0)
MCH: 27.8 pg (ref 26.0–34.0)
MCHC: 33.5 g/dL (ref 30.0–36.0)
MCV: 82.7 fL (ref 78.0–100.0)
Platelets: 266 10*3/uL (ref 150–400)
RBC: 5.62 MIL/uL (ref 4.22–5.81)
RDW: 13.6 % (ref 11.5–15.5)
WBC: 9 10*3/uL (ref 4.0–10.5)

## 2016-07-11 LAB — BASIC METABOLIC PANEL
ANION GAP: 8 (ref 5–15)
BUN: 18 mg/dL (ref 6–20)
CALCIUM: 9.7 mg/dL (ref 8.9–10.3)
CO2: 24 mmol/L (ref 22–32)
Chloride: 105 mmol/L (ref 101–111)
Creatinine, Ser: 1.3 mg/dL — ABNORMAL HIGH (ref 0.61–1.24)
GLUCOSE: 91 mg/dL (ref 65–99)
POTASSIUM: 3.7 mmol/L (ref 3.5–5.1)
SODIUM: 137 mmol/L (ref 135–145)

## 2016-07-11 LAB — TROPONIN I

## 2016-07-11 MED ORDER — ASPIRIN 81 MG PO CHEW
243.0000 mg | CHEWABLE_TABLET | Freq: Once | ORAL | Status: AC
  Administered 2016-07-11: 243 mg via ORAL
  Filled 2016-07-11: qty 3

## 2016-07-11 NOTE — ED Notes (Signed)
EKG given to Dr. Wentz 

## 2016-07-11 NOTE — ED Provider Notes (Addendum)
[facility] DEPT Provider Note   CSN: [id number] Arrival date & time: 07/11/16  1246     History   Chief Complaint Chief Complaint  Patient presents with  . Chest Pain    HPI Donald Mcgrath is a 36 y.o. male.  He presents for evaluation of chest pain stated as "sharp", for 3 days, persistent.  No known trauma.  No associated diaphoresis, nausea, weakness or dizziness.  He is not currently seeing cardiology.  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  He takes Norvasc, for hypertension.  He works as a Naval architect.  He has not had any recent nausea, vomiting, change in bowel or urinary habits.  He does not have chronic heartburn symptoms.  There are no other known modifying factors.  HPI  Past Medical History:  Diagnosis Date  . Acute tonsillitis 06/2014  . History of cardiac cath 2013   "evidence of coronary spasm"; no obstruction  . HTN (hypertension)    states under control with med., has been on med. x 3 yr.  . MI, old   . Tonsillar calculus 06/2014    Patient Active Problem List   Diagnosis Date Noted  . HTN (hypertension) 05/19/2013  . 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 Pleuritic chest pain 05/19/2011  . NSTEMI (non-ST elevated myocardial infarction) (HCC) 05/18/2011    Past Surgical History:  Procedure Laterality Date  . APPENDECTOMY    . LEFT HEART CATHETERIZATION WITH CORONARY ANGIOGRAM N/A 05/18/2011   Procedure: LEFT HEART CATHETERIZATION WITH CORONARY ANGIOGRAM;  Surgeon: [name], MD;  Location: [facility] CATH LAB;  Service: Cardiovascular;  Laterality: N/A;       Home Medications    Prior to Admission medications   Medication Sig Start Date End Date Taking? Authorizing Provider  amLODipine (NORVASC) 5 MG tablet Take 1 tablet (5 mg total) by mouth daily. 02/06/13  Yes BranchDorothe Pea, MD  aspirin EC 81 MG tablet Take 81 mg by mouth daily.   Yes [provider]  Multiple Vitamin (MULTI VITAMIN DAILY PO) Take 1  tablet by mouth daily.   Yes [provider]  Omega-3 Fatty Acids (FISH OIL) 1000 MG CAPS Take 1 capsule by mouth daily.   Yes [provider]    Family History Family History  Problem Relation Age of Onset  . Stroke Mother   . Stroke Father   . Hypertension Father   . Coronary artery disease Father     Social History Social History  Substance Use Topics  . Smoking status: Never Smoker  . Smokeless tobacco: Never Used  . Alcohol use No     Allergies   Patient has no known allergies.   Review of Systems Review of Systems  All other systems reviewed and are negative.    Physical Exam Updated Vital Signs BP (!) 138/102   Pulse (!) 50   Temp 98 F (36.7 C) (Oral)   Resp 12   SpO2 95%   Physical Exam  Constitutional: He is oriented to person, place, and time. He appears well-developed and well-nourished. No distress.  HENT:  Head: Normocephalic and atraumatic.  Right Ear: External ear normal.  Left Ear: External ear normal.  Eyes: Conjunctivae and EOM are normal. Pupils are equal, round, and reactive to light.  Neck: Normal range of motion and phonation normal. Neck supple.  Cardiovascular: Normal rate, regular rhythm and normal heart sounds.   Pulmonary/Chest: Effort normal and breath sounds  normal. He exhibits no bony tenderness.  Abdominal: Soft. There is no tenderness.  Musculoskeletal: Normal range of motion.  Neurological: He is alert and oriented to person, place, and time. No cranial nerve deficit or sensory deficit. He exhibits normal muscle tone. Coordination normal.  Skin: Skin is warm, dry and intact.  Psychiatric: He has a normal mood and affect. His behavior is normal. Judgment and thought content normal.  Nursing note and vitals reviewed.    ED Treatments / Results  Labs (all labs ordered are listed, but only abnormal results are displayed) Labs Reviewed  BASIC METABOLIC PANEL - Abnormal; Notable for the following:        Result Value   Creatinine, Ser 1.30 (*)    All other components within normal limits  CBC  TROPONIN I    EKG  EKG Interpretation  Date/Time:  Wednesday Jul 11 2016 13:14:54 EDT Ventricular Rate:  60 PR Interval:  158 QRS Duration: 98 QT Interval:  394 QTC Calculation: 394 R Axis:   27 Text Interpretation:  Normal sinus rhythm with sinus arrhythmia Normal ECG Since last tracing rate faster Confirmed by Mancel BaleWentz, Jannely Henthorn [phone]([id number]) on 07/11/2016 4:22:22 PM       Radiology Dg Chest 2 View  Result Date: 07/11/2016 CLINICAL DATA: Intermittent anterior chest pain for few days, shortness of breath. History of hypertension. EXAM: CHEST  2 VIEW COMPARISON:  Chest radiograph August 23, 2015 FINDINGS: Cardiomediastinal silhouette is normal. No pleural effusions or focal consolidations. Trachea projects midline and there is no pneumothorax. Soft tissue planes and included osseous structures are non-suspicious. IMPRESSION: Stable examination: Normal chest. Electronically Signed   By: Awilda Metroourtnay  Bloomer M.D.   On: 07/11/2016 13:35    Procedures Procedures (including critical care time)  Medications Ordered in ED Medications  aspirin chewable tablet 243 mg (243 mg Oral Given 07/11/16 1335)     Initial Impression / Assessment and Plan / ED Course  I have reviewed the triage vital signs and the nursing notes.  Pertinent labs & imaging results that were available during my care of the patient were reviewed by me and considered in my medical decision making (see chart for details).  Clinical Course as of Jul 11 1620  Wed Jul 11, 2016  1545 The patient was ambulated, because of episodes of low heart beat while sleeping here in the emergency department.  With ambulation he was asymptomatic with heart rate in the 70s.  [EW]    Clinical Course User Index [EW] Mancel BaleWentz, Itay Mella, MD       Medications  aspirin chewable tablet 243 mg (243 mg Oral Given 07/11/16 1335)    Patient Vitals for the past 24  hrs:  BP Temp Temp src Pulse Resp SpO2  07/11/16 1550 (!) 138/102 - - (!) 50 12 95 %  07/11/16 1430 - - - (!) 43 13 96 %  07/11/16 1316 (!) 150/87 98 F (36.7 C) Oral 64 17 98 %    3:46 PM Reevaluation with update and discussion. After initial assessment and treatment, an updated evaluation reveals he remains comfortable and denies change in clinical status.  There are no other no modifying factors.Mancel Bale. Roselinda Bahena L    Final Clinical Impressions(s) / ED Diagnoses   Final diagnoses:  Nonspecific chest pain   Chest pain, nonspecific, with reassuring evaluation.  Suspect GI source.  Doubt ACS, PE or pneumonia.  Nursing Notes Reviewed/ Care Coordinated Applicable Imaging Reviewed Interpretation of Laboratory Data incorporated into ED treatment  The patient appears  reasonably screened and/or stabilized for discharge and I doubt any other medical condition or other [facility] requiring further screening, evaluation, or treatment in the ED at this time prior to discharge.  Plan: Home Medications-antacid of choice before meals at bedtime continue usual medications; Home Treatments-rest, fluids; return here if the recommended treatment, does not improve the symptoms; Recommended follow up-PCP follow-up 1 week.   New Prescriptions Discharge Medication List as of 07/11/2016  3:43 PM       Mancel Bale, MD 07/11/16 1547    Mancel Bale, MD 07/11/16 1622

## 2016-07-11 NOTE — Discharge Instructions (Signed)
The testing today did not show any serious cause for the chest discomfort.  The discomfort may be caused from esophageal reflux.  I taken an antacid such as Rolaids or Maalox, before meals and at bedtime for 1 week.  Your heart rate was somewhat low while you are resting here in the emergency department.  When you are up and walking her heart rate improves.  If you feel dizzy or weak, sit down.  Talked your doctor when you see him about the episode of low heart rate.  Your doctor for checkup next week.

## 2016-07-11 NOTE — ED Triage Notes (Signed)
Pt c/o intermittent chest stinging to epigastric area with sob that lasts approx 1-2 seconds. nad at this time . Hx of MI x 2

## 2016-07-11 NOTE — ED Notes (Signed)
Pt took 1 81mg  asa today.

## 2016-07-11 NOTE — ED Notes (Signed)
Pt ambulatory without difficulty.  NSR in the 70's while ambulating.

## 2017-01-05 DIAGNOSIS — I10 Essential (primary) hypertension: Secondary | ICD-10-CM | POA: Insufficient documentation

## 2017-01-05 DIAGNOSIS — Z79899 Other long term (current) drug therapy: Secondary | ICD-10-CM | POA: Insufficient documentation

## 2017-01-05 DIAGNOSIS — Z7982 Long term (current) use of aspirin: Secondary | ICD-10-CM | POA: Insufficient documentation

## 2017-01-05 DIAGNOSIS — H16133 Photokeratitis, bilateral: Secondary | ICD-10-CM | POA: Insufficient documentation

## 2017-01-06 ENCOUNTER — Emergency Department (HOSPITAL_COMMUNITY)
Admission: EM | Admit: 2017-01-06 | Discharge: 2017-01-06 | Disposition: A | Discharge status: Home / Self Care | Payer: Self-pay | Attending: Emergency Medicine | Admitting: Emergency Medicine

## 2017-01-06 ENCOUNTER — Encounter (HOSPITAL_COMMUNITY): Payer: Self-pay | Admitting: *Deleted

## 2017-01-06 DIAGNOSIS — H16133 Photokeratitis, bilateral: Secondary | ICD-10-CM

## 2017-01-06 MED ORDER — TOBRAMYCIN 0.3 % OP SOLN
2.0000 [drp] | Freq: Four times a day (QID) | OPHTHALMIC | Status: DC
  Administered 2017-01-06: 2 [drp] via OPHTHALMIC
  Filled 2017-01-06: qty 5

## 2017-01-06 MED ORDER — KETOROLAC TROMETHAMINE 0.5 % OP SOLN
1.0000 [drp] | Freq: Four times a day (QID) | OPHTHALMIC | Status: DC | PRN
  Administered 2017-01-06: 1 [drp] via OPHTHALMIC
  Filled 2017-01-06: qty 5

## 2017-01-06 MED ORDER — TETRACAINE HCL 0.5 % OP SOLN
OPHTHALMIC | Status: AC
  Administered 2017-01-06: 02:00:00
  Filled 2017-01-06: qty 4

## 2017-01-06 MED ORDER — FLUORESCEIN SODIUM 1 MG OP STRP
ORAL_STRIP | OPHTHALMIC | Status: AC
  Administered 2017-01-06: 1
  Filled 2017-01-06: qty 2

## 2017-01-06 NOTE — ED Notes (Signed)
Pt unable to open his eyes due to pain for visual acuity,

## 2017-01-06 NOTE — ED Provider Notes (Signed)
[facility] EMERGENCY DEPARTMENT Provider Note   CSN: [id number] Arrival date & time: 01/05/17  2357  Time seen 03:35 AM   History   Chief Complaint Chief Complaint  Patient presents with  . Eye Pain    HPI Donald Mcgrath is a 36 y.o. male.  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  Patient did not wear a welding shield.  He states on the way home he felt like his eyes were burning.  However he went hunting and they went out to eat and when he laid down to go to bed about 9 PM they seem to be getting worse.  However he was able to go us to sleep until about 11 PM when the pain woke him up.  Wife reports he was having lots of tearing.  He complains of blurred vision and difficulty opening his eyes due to discomfort.  He states he feels like he has "sand in my eyes".  PCP Kirstie PeriShah, Ashish, MD   Past Medical History:  Diagnosis Date  . Acute tonsillitis 06/2014  . History of cardiac cath 2013   "evidence of coronary spasm"; no obstruction  . HTN (hypertension)    states under control with med., has been on med. x 3 yr.  . MI, old   . Tonsillar calculus 06/2014    Patient Active Problem List   Diagnosis Date Noted  . HTN (hypertension) 05/19/2013  . 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 Pleuritic chest pain 05/19/2011  . NSTEMI (non-ST elevated myocardial infarction) (HCC) 05/18/2011    Past Surgical History:  Procedure Laterality Date  . APPENDECTOMY         Home Medications    Prior to Admission medications   Medication Sig Start Date End Date Taking? Authorizing Provider  amLODipine (NORVASC) 5 MG tablet Take 1 tablet (5 mg total) by mouth daily. 02/06/13   Antoine PocheBranch, Jonathan F, MD  aspirin EC 81 MG tablet Take 81 mg by mouth daily.    [provider]  Multiple Vitamin (MULTI VITAMIN DAILY PO) Take 1 tablet by mouth daily.    [provider]  Omega-3  Fatty Acids (FISH OIL) 1000 MG CAPS Take 1 capsule by mouth daily.    [provider]    Family History Family History  Problem Relation Age of Onset  . Stroke Mother   . Stroke Father   . Hypertension Father   . Coronary artery disease Father     Social History Social History   Tobacco Use  . Smoking status: Never Smoker  . Smokeless tobacco: Never Used  Substance Use Topics  . Alcohol use: No  . Drug use: No  employed   Allergies   Patient has no known allergies.   Review of Systems Review of Systems  All other systems reviewed and are negative.    Physical Exam Updated Vital Signs BP (!) 161/124   Pulse 82   Temp 98.3 F (36.8 C) (Oral)   Resp 20   Ht 5\' 9"  (1.753 m)   Wt 90.7 kg (200 lb)   SpO2 98%   BMI 29.53 kg/m   Vital signs normal except for hypertension   Physical Exam  Constitutional: He is oriented to person, place, and time. He appears well-developed and well-nourished. He appears distressed.  HENT:  Head: Normocephalic and atraumatic.  Right Ear:  External ear normal.  Left Ear: External ear normal.  Nose: Nose normal.  Eyes:  Patient is unable to open his eyes until I put tetracaine in his eyes.  Then his pupils are equal and reactive, he has diffuse conjunctival injection bilaterally.  He has having a lot of clear tearing.  Fluorescein staining was negative for corneal ulcer.  No foreign body seen.  Neck: Normal range of motion.  Cardiovascular: Normal rate.  Pulmonary/Chest: Effort normal. No respiratory distress.  Musculoskeletal: Normal range of motion.  Neurological: He is alert and oriented to person, place, and time.  Skin: Skin is warm and dry.  Psychiatric: He has a normal mood and affect. His behavior is normal. Thought content normal.  Nursing note and vitals reviewed.    Visual Acuity  Right Eye Distance:   Left Eye Distance:   Bilateral Distance: 20/20  Right Eye Near:   Left Eye Near:    Bilateral Near:      ED Treatments / Results  Labs (all labs ordered are listed, but only abnormal results are displayed) Labs Reviewed - No data to display  EKG  EKG Interpretation None       Radiology No results found.  Procedures Procedures (including critical care time)  Medications Ordered in ED Medications  ketorolac (ACULAR) 0.5 % ophthalmic solution 1 drop (not administered)  tobramycin (TOBREX) 0.3 % ophthalmic solution 2 drop (not administered)  fluorescein 1 MG ophthalmic strip (1 strip  Given 01/06/17 0142)  tetracaine (PONTOCAINE) 0.5 % ophthalmic solution (  Given 01/06/17 0142)     Initial Impression / Assessment and Plan / ED Course  I have reviewed the triage vital signs and the nursing notes.  Pertinent labs & imaging results that were available during my care of the patient were reviewed by me and considered in my medical decision making (see chart for details).    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  He was started on ketorolac eyedrops for pain and tobramycin antibiotics to prevent infection.  Final Clinical Impressions(s) / ED Diagnoses   Final diagnoses:  Photokeratitis of both eyes  Welders' flash, bilateral    ED Discharge Orders    None    Ketorolac and tobramycin eye drops  Plan discharge  Devoria AlbeIva Clark Clowdus, MD, [facility] PyoFACEP    Khaliq Turay, MD 01/06/17 [phone]

## 2017-01-06 NOTE — ED Notes (Signed)
No answer in waiting room, triage RN had placed pt in waiting room behind triage due to lower light but pt was not there or in lobby, registration states that they think pt may have went outside,

## 2017-01-06 NOTE — ED Triage Notes (Signed)
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

## 2017-01-06 NOTE — Discharge Instructions (Signed)
Use the ketorolac eye drops 1 in each eye every 6 hrs for pain as needed. Use the tobramycin eye drops 1 drop in each every every 4 hrs while awake the first 24 hours then 4 times a day for 2 days after your pain is gone. Wear sunglasses until your pain is better. If your pain isn't improving in the next 24 hours or you seem to be getting worse, call Dr Merrilee SeashoreMincer's office to be rechecked.
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
## 2019-09-22 ENCOUNTER — Emergency Department (HOSPITAL_COMMUNITY)
Admission: EM | Admit: 2019-09-22 | Discharge: 2019-09-22 | Disposition: A | Discharge status: Home / Self Care | Payer: 59 | Attending: Emergency Medicine | Admitting: Emergency Medicine

## 2019-09-22 ENCOUNTER — Emergency Department (HOSPITAL_COMMUNITY): Payer: 59

## 2019-09-22 ENCOUNTER — Encounter (HOSPITAL_COMMUNITY): Payer: Self-pay

## 2019-09-22 ENCOUNTER — Other Ambulatory Visit: Payer: Self-pay

## 2019-09-22 ENCOUNTER — Emergency Department (HOSPITAL_COMMUNITY): Admission: EM | Admit: 2019-09-22 | Discharge: 2019-09-22 | Discharge status: Absent without leave | Payer: 59

## 2019-09-22 DIAGNOSIS — R0789 Other chest pain: Secondary | ICD-10-CM | POA: Insufficient documentation

## 2019-09-22 DIAGNOSIS — Z955 Presence of coronary angioplasty implant and graft: Secondary | ICD-10-CM | POA: Diagnosis not present

## 2019-09-22 DIAGNOSIS — R079 Chest pain, unspecified: Secondary | ICD-10-CM

## 2019-09-22 DIAGNOSIS — I25111 Atherosclerotic heart disease of native coronary artery with angina pectoris with documented spasm: Secondary | ICD-10-CM | POA: Insufficient documentation

## 2019-09-22 DIAGNOSIS — Z7982 Long term (current) use of aspirin: Secondary | ICD-10-CM | POA: Diagnosis not present

## 2019-09-22 DIAGNOSIS — I10 Essential (primary) hypertension: Secondary | ICD-10-CM | POA: Diagnosis not present

## 2019-09-22 DIAGNOSIS — R1010 Upper abdominal pain, unspecified: Secondary | ICD-10-CM | POA: Diagnosis not present

## 2019-09-22 DIAGNOSIS — Z79899 Other long term (current) drug therapy: Secondary | ICD-10-CM | POA: Diagnosis not present

## 2019-09-22 LAB — BASIC METABOLIC PANEL
Anion gap: 10 (ref 5–15)
BUN: 16 mg/dL (ref 6–20)
CO2: 23 mmol/L (ref 22–32)
Calcium: 9 mg/dL (ref 8.9–10.3)
Chloride: 100 mmol/L (ref 98–111)
Creatinine, Ser: 0.99 mg/dL (ref 0.61–1.24)
GFR calc Af Amer: >60 mL/min (ref >60)
GFR calc non Af Amer: >60 mL/min (ref >60)
Glucose, Bld: 101 mg/dL — ABNORMAL HIGH (ref 70–99)
Potassium: 3.9 mmol/L (ref 3.5–5.1)
Sodium: 133 mmol/L — ABNORMAL LOW (ref 135–145)

## 2019-09-22 LAB — CBC
HCT: 47.6 % (ref 39.0–52.0)
Hemoglobin: 15.7 g/dL (ref 13.0–17.0)
MCH: 28.3 pg (ref 26.0–34.0)
MCHC: 33 g/dL (ref 30.0–36.0)
MCV: 85.9 fL (ref 80.0–100.0)
Platelets: 282 10*3/uL (ref 150–400)
RBC: 5.54 MIL/uL (ref 4.22–5.81)
RDW: 13 % (ref 11.5–15.5)
WBC: 9.7 10*3/uL (ref 4.0–10.5)
nRBC: 0 % (ref 0.0–0.2)

## 2019-09-22 LAB — HEPATIC FUNCTION PANEL
ALT: 22 U/L (ref 0–44)
AST: 21 U/L (ref 15–41)
Albumin: 4.4 g/dL (ref 3.5–5.0)
Alkaline Phosphatase: 49 U/L (ref 38–126)
Bilirubin, Direct: 0.1 mg/dL (ref 0.0–0.2)
Indirect Bilirubin: 0.7 mg/dL (ref 0.3–0.9)
Total Bilirubin: 0.8 mg/dL (ref 0.3–1.2)
Total Protein: 7.2 g/dL (ref 6.5–8.1)

## 2019-09-22 LAB — TROPONIN I (HIGH SENSITIVITY)
Troponin I (High Sensitivity): <2 ng/L (ref <18)
Troponin I (High Sensitivity): <2 ng/L (ref <18)

## 2019-09-22 LAB — LIPASE, BLOOD: Lipase: 22 U/L (ref 11–51)

## 2019-09-22 MED ORDER — FAMOTIDINE 20 MG PO TABS
20.0000 mg | ORAL_TABLET | Freq: Once | ORAL | Status: AC
  Administered 2019-09-22: 20 mg via ORAL
  Filled 2019-09-22: qty 1

## 2019-09-22 MED ORDER — ONDANSETRON HCL 4 MG/2ML IJ SOLN
4.0000 mg | Freq: Once | INTRAMUSCULAR | Status: AC
  Administered 2019-09-22: 4 mg via INTRAVENOUS
  Filled 2019-09-22: qty 2

## 2019-09-22 MED ORDER — SODIUM CHLORIDE 0.9% FLUSH: 3.0000 mL | Freq: Once | INTRAVENOUS | Status: DC

## 2019-09-22 MED ORDER — ONDANSETRON 4 MG PO TBDP
ORAL_TABLET | ORAL | 0 refills | Status: AC
Start: 2019-09-22 — End: ?

## 2019-09-22 NOTE — Discharge Instructions (Signed)
Use Pepcid as needed for stomach discomfort. Use Zofran as needed for nausea and vomiting. Return for new or worsening symptoms.

## 2019-09-22 NOTE — ED Triage Notes (Signed)
Pt began to having CP that started this morning at 4:30 am. Pt describes pain as pressure. He denies any lightheadedness. Does have SOB. Is also having upper abdominal pain after eating [place] food.

## 2019-09-22 NOTE — ED Notes (Signed)
Patient transported to X-ray 

## 2019-09-22 NOTE — ED Provider Notes (Signed)
[facility] EMERGENCY DEPARTMENT Provider Note   CSN: [id number] Arrival date & time: 09/22/19  1114     History Chief Complaint  Patient presents with  . Chest Pain    Donald Mcgrath is a 39 y.o. male.  Patient with history of coronary vasospasm, high blood pressure presents with upper stomach and chest discomfort since last night.  Fairly constant.  Patient concerned might be food poisoning has occurred after eating.  Pain with pushing on his abdomen.  This is different from when he had his chest pain and vasospasm.  Currently no chest pain or shortness of breath he had brief chest pain but that was at the same time as his abdominal discomfort and was located mostly in the abdomen.  No exertional symptoms.  No blood in the stools.  Nausea without vomiting.        Past Medical History:  Diagnosis Date  . Acute tonsillitis 06/2014  . History of cardiac cath 2013   "evidence of coronary spasm"; no obstruction  . HTN (hypertension)    states under control with med., has been on med. x 3 yr.  . MI, old   . Tonsillar calculus 06/2014    Patient Active Problem List   Diagnosis Date Noted  . HTN (hypertension) 05/19/2013  . 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 Pleuritic chest pain 05/19/2011  . NSTEMI (non-ST elevated myocardial infarction) (HCC) 05/18/2011    Past Surgical History:  Procedure Laterality Date  . APPENDECTOMY    . LEFT HEART CATHETERIZATION WITH CORONARY ANGIOGRAM N/A 05/18/2011   Procedure: LEFT HEART CATHETERIZATION WITH CORONARY ANGIOGRAM;  Surgeon: [name], MD;  Location: [facility] CATH LAB;  Service: Cardiovascular;  Laterality: N/A;       Family History  Problem Relation Age of Onset  . Stroke Mother   . Stroke Father   . Hypertension Father   . Coronary artery disease Father     Social History   Tobacco Use  . Smoking status: Never Smoker  . Smokeless tobacco: Never Used  Vaping Use  . Vaping Use: Never used  Substance Use Topics  .  Alcohol use: No  . Drug use: No    Home Medications Prior to Admission medications   Medication Sig Start Date End Date Taking? Authorizing Provider  amLODipine (NORVASC) 5 MG tablet Take 1 tablet (5 mg total) by mouth daily. 02/06/13   [name], MD  aspirin EC 81 MG tablet Take 81 mg by mouth daily.    [provider]  Multiple Vitamin (MULTI VITAMIN DAILY PO) Take 1 tablet by mouth daily.    [provider]  Omega-3 Fatty Acids (FISH OIL) 1000 MG CAPS Take 1 capsule by mouth daily.    [provider]  ondansetron (ZOFRAN ODT) 4 MG disintegrating tablet 4mg  ODT q4 hours prn nausea/vomit 09/22/19   09/24/19, MD    Allergies    Patient has no known allergies.  Review of Systems   Review of Systems  Constitutional: Negative for chills and fever.  HENT: Negative for congestion.   Eyes: Negative for visual disturbance.  Respiratory: Negative for shortness of breath.   Cardiovascular: Positive for chest pain. Negative for leg swelling.  Gastrointestinal: Positive for abdominal pain, diarrhea and nausea. Negative for vomiting.  Genitourinary: Negative for dysuria and flank pain.  Musculoskeletal: Negative for back pain, neck pain and neck stiffness.  Skin: Negative for rash.  Neurological: Negative for light-headedness and headaches.  Physical Exam Updated Vital Signs BP (!) 153/98   Pulse 60   Temp 98.1 F (36.7 C)   Resp 16   Ht 5\' 9"  (1.753 m)   Wt (!) 95.3 kg   SpO2 98%   BMI 31.01 kg/m   Physical Exam Vitals and nursing note reviewed.  Constitutional:      Appearance: He is well-developed.  HENT:     Head: Normocephalic and atraumatic.  Eyes:     General:        Right eye: No discharge.        Left eye: No discharge.     Conjunctiva/sclera: Conjunctivae normal.  Neck:     Trachea: No tracheal deviation.  Cardiovascular:     Rate and Rhythm: Normal rate and regular rhythm.  Pulmonary:     Effort: Pulmonary effort is  normal.     Breath sounds: Normal breath sounds.  Abdominal:     General: There is no distension.     Palpations: Abdomen is soft.     Tenderness: There is abdominal tenderness (epig). There is no guarding.  Musculoskeletal:     Cervical back: Normal range of motion and neck supple.  Skin:    General: Skin is warm.     Findings: No rash.  Neurological:     Mental Status: He is alert and oriented to person, place, and time.     ED Results / Procedures / Treatments   Labs (all labs ordered are listed, but only abnormal results are displayed) Labs Reviewed  BASIC METABOLIC PANEL - Abnormal; Notable for the following components:      Result Value   Sodium 133 (*)    Glucose, Bld 101 (*)    All other components within normal limits  CBC  LIPASE, BLOOD  HEPATIC FUNCTION PANEL  TROPONIN I (HIGH SENSITIVITY)  TROPONIN I (HIGH SENSITIVITY)    EKG EKG Interpretation  Date/Time:  Tuesday September 22 2019 11:19:24 EDT Ventricular Rate:  55 PR Interval:  152 QRS Duration: 104 QT Interval:  412 QTC Calculation: 394 R Axis:   74 Text Interpretation: Sinus bradycardia Abnormal QRS-T angle, consider primary T wave abnormality Abnormal ECG Confirmed by 09-29-1978 [id number]) on 09/22/2019 11:58:43 AM   Radiology DG Chest 2 View  Result Date: 09/22/2019 CLINICAL DATA:  Chest pain. Additional provided: Left-sided chest pain for 1 day. Patient reports history of myocardial infarction 10 years ago. EXAM: CHEST - 2 VIEW COMPARISON:  Prior chest radiographs 07/11/2016 and earlier FINDINGS: Heart size within normal limits. There is no appreciable airspace consolidation. No evidence of pleural effusion or pneumothorax. No acute bony abnormality identified. IMPRESSION: No evidence of active cardiopulmonary disease. Electronically Signed   By: 07/13/2016 DO   On: 09/22/2019 11:53    Procedures Procedures (including critical care time)  Medications Ordered in ED Medications  sodium chloride  flush (NS) 0.9 % injection 3 mL (3 mLs Intravenous Not Given 09/22/19 1201)  ondansetron (ZOFRAN) injection 4 mg (4 mg Intravenous Given 09/22/19 1435)  famotidine (PEPCID) tablet 20 mg (20 mg Oral Given 09/22/19 1434)    ED Course  I have reviewed the triage vital signs and the nursing notes.  Pertinent labs & imaging results that were available during my care of the patient were reviewed by me and considered in my medical decision making (see chart for details).    MDM Rules/Calculators/A&P  Patient presents with primarily upper abdominal discomfort and nausea possibly related to recent [place] food as significant other has similar symptoms.  No active chest pain.  This is different than his previous cardiac vasospasm.  Blood work reviewed, delayed results however normal white count, Na 133, normal hemoglobin, normal lipase and LFTs.  EKG no ischemic findings.  Patient stable for outpatient follow-up with Zofran.  Reasons to return discussed.  Patient comfortable this plan. Patient improved with supportive care medications.  Final Clinical Impression(s) / ED Diagnoses Final diagnoses:  Pain of upper abdomen  Acute chest pain    Rx / DC Orders ED Discharge Orders         Ordered    ondansetron (ZOFRAN ODT) 4 MG disintegrating tablet     Discontinue  Reprint     09/22/19 1559           Blane Ohara, MD 09/22/19 1655

## 2019-09-22 NOTE — ED Notes (Signed)
Called again for triage and unable to locate.

## 2019-09-22 NOTE — ED Notes (Signed)
Unable to find pt for triage. Called x3.

## 2022-10-29 ENCOUNTER — Other Ambulatory Visit: Payer: Self-pay

## 2022-10-29 ENCOUNTER — Emergency Department (HOSPITAL_COMMUNITY)
Admission: EM | Admit: 2022-10-29 | Discharge: 2022-10-29 | Disposition: A | Discharge status: Home / Self Care | Payer: Medicaid Other | Attending: Emergency Medicine | Admitting: Emergency Medicine

## 2022-10-29 ENCOUNTER — Emergency Department (HOSPITAL_COMMUNITY): Payer: Medicaid Other

## 2022-10-29 ENCOUNTER — Encounter (HOSPITAL_COMMUNITY): Payer: Self-pay

## 2022-10-29 DIAGNOSIS — Z79899 Other long term (current) drug therapy: Secondary | ICD-10-CM | POA: Insufficient documentation

## 2022-10-29 DIAGNOSIS — R079 Chest pain, unspecified: Secondary | ICD-10-CM | POA: Insufficient documentation

## 2022-10-29 DIAGNOSIS — I10 Essential (primary) hypertension: Secondary | ICD-10-CM | POA: Diagnosis not present

## 2022-10-29 DIAGNOSIS — Z7982 Long term (current) use of aspirin: Secondary | ICD-10-CM | POA: Insufficient documentation

## 2022-10-29 DIAGNOSIS — E119 Type 2 diabetes mellitus without complications: Secondary | ICD-10-CM | POA: Insufficient documentation

## 2022-10-29 DIAGNOSIS — N289 Disorder of kidney and ureter, unspecified: Secondary | ICD-10-CM

## 2022-10-29 LAB — CBC WITH DIFFERENTIAL/PLATELET
Abs Immature Granulocytes: 0.02 10*3/uL (ref 0.00–0.07)
Basophils Absolute: 0 10*3/uL (ref 0.0–0.1)
Basophils Relative: 0 %
Eosinophils Absolute: 0.3 10*3/uL (ref 0.0–0.5)
Eosinophils Relative: 4 %
HCT: 44 % (ref 39.0–52.0)
Hemoglobin: 14.4 g/dL (ref 13.0–17.0)
Immature Granulocytes: 0 %
Lymphocytes Relative: 35 %
Lymphs Abs: 2.7 10*3/uL (ref 0.7–4.0)
MCH: 28.6 pg (ref 26.0–34.0)
MCHC: 32.7 g/dL (ref 30.0–36.0)
MCV: 87.5 fL (ref 80.0–100.0)
Monocytes Absolute: 0.4 10*3/uL (ref 0.1–1.0)
Monocytes Relative: 5 %
Neutro Abs: 4.1 10*3/uL (ref 1.7–7.7)
Neutrophils Relative %: 56 %
Platelets: 237 10*3/uL (ref 150–400)
RBC: 5.03 MIL/uL (ref 4.22–5.81)
RDW: 13.3 % (ref 11.5–15.5)
WBC: 7.5 10*3/uL (ref 4.0–10.5)
nRBC: 0 % (ref 0.0–0.2)

## 2022-10-29 LAB — BASIC METABOLIC PANEL
Anion gap: 5 (ref 5–15)
BUN: 23 mg/dL — ABNORMAL HIGH (ref 6–20)
CO2: 25 mmol/L (ref 22–32)
Calcium: 8.7 mg/dL — ABNORMAL LOW (ref 8.9–10.3)
Chloride: 107 mmol/L (ref 98–111)
Creatinine, Ser: 1.35 mg/dL — ABNORMAL HIGH (ref 0.61–1.24)
GFR, Estimated: >60 mL/min (ref >60)
Glucose, Bld: 98 mg/dL (ref 70–99)
Potassium: 3.8 mmol/L (ref 3.5–5.1)
Sodium: 137 mmol/L (ref 135–145)

## 2022-10-29 LAB — TROPONIN I (HIGH SENSITIVITY)
Troponin I (High Sensitivity): 2 ng/L (ref <18)
Troponin I (High Sensitivity): <2 ng/L (ref <18)

## 2022-10-29 LAB — D-DIMER, QUANTITATIVE: D-Dimer, Quant: <0.27 ug{FEU}/mL (ref 0.00–0.50)

## 2022-10-29 MED ORDER — AMLODIPINE BESYLATE 5 MG PO TABS: 5.0000 mg | ORAL_TABLET | Freq: Every day | ORAL | 0 refills | Status: DC

## 2022-10-29 MED ORDER — NITROGLYCERIN 0.4 MG SL SUBL: 0.4000 mg | SUBLINGUAL_TABLET | SUBLINGUAL | 0 refills | Status: DC | PRN

## 2022-10-29 MED ORDER — NITROGLYCERIN 0.4 MG SL SUBL: 0.4000 mg | SUBLINGUAL_TABLET | SUBLINGUAL | Status: DC | PRN

## 2022-10-29 MED ORDER — AMLODIPINE BESYLATE 5 MG PO TABS: 5.0000 mg | ORAL_TABLET | Freq: Every day | ORAL | 0 refills | Status: AC

## 2022-10-29 MED ORDER — AMLODIPINE BESYLATE 5 MG PO TABS
5.0000 mg | ORAL_TABLET | Freq: Once | ORAL | Status: AC
  Administered 2022-10-29: 5 mg via ORAL
  Filled 2022-10-29: qty 1

## 2022-10-29 MED ORDER — ASPIRIN 81 MG PO CHEW
324.0000 mg | CHEWABLE_TABLET | Freq: Once | ORAL | Status: AC
  Administered 2022-10-29: 324 mg via ORAL
  Filled 2022-10-29: qty 4

## 2022-10-29 MED ORDER — NITROGLYCERIN 0.4 MG SL SUBL: 0.4000 mg | SUBLINGUAL_TABLET | SUBLINGUAL | 0 refills | Status: AC | PRN

## 2022-10-29 NOTE — ED Notes (Signed)
ED Provider at bedside. 

## 2022-10-29 NOTE — ED Triage Notes (Signed)
Pt arrived via POV from home c/o CP X 2 weeks. Pt endorses SOB, denies N/V. Pt reports Hx of MI.

## 2022-10-29 NOTE — ED Provider Notes (Signed)
[name] EMERGENCY DEPARTMENT AT [facility] Provider Note   CSN: [id number] Arrival date & time: 10/29/22  8469     History  Chief Complaint  Patient presents with   Chest Pain    Donald Mcgrath is a 42 y.o. male.   Chest Pain He has history of hypertension, coronary vasospasm and comes in because of chest pain which have been occurring for the last 2 weeks.  Pains are in the midsternal area without radiation.  He describes a sharp pain which is worse when he takes a deep breath.  There is no associated dyspnea or nausea but he has had diaphoresis.  Tonight, he noted that his left arm was feeling numb and that was what precipitated his coming to the emergency department.  He is a non-smoker.  He denies history of diabetes, hyperlipidemia.  There is a family history of premature coronary atherosclerosis.  Of note, he is supposed to be taking amlodipine, but states he is no longer taking it.   Home Medications Prior to Admission medications   Medication Sig Start Date End Date Taking? Authorizing Provider  amLODipine (NORVASC) 5 MG tablet Take 1 tablet (5 mg total) by mouth daily. 02/06/13   [name], MD  aspirin EC 81 MG tablet Take 81 mg by mouth daily.    [provider]  Multiple Vitamin (MULTI VITAMIN DAILY PO) Take 1 tablet by mouth daily.    [provider]  Omega-3 Fatty Acids (FISH OIL) 1000 MG CAPS Take 1 capsule by mouth daily.    [provider]  ondansetron (ZOFRAN ODT) 4 MG disintegrating tablet 4mg  ODT q4 hours prn nausea/vomit 09/22/19   Blane Ohara, MD      Allergies    Patient has no known allergies.    Review of Systems   Review of Systems  Cardiovascular:  Positive for chest pain.  All other systems reviewed and are negative.   Physical Exam Updated Vital Signs BP (!) 145/94   Pulse 64   Temp (!) 97.4 F (36.3 C)   Resp 17   Ht 5\' 9"  (1.753 m)   Wt 91.6 kg   SpO2 99%   BMI 29.83 kg/m  Physical  Exam Vitals and nursing note reviewed.   42 year old male, resting comfortably and in no acute distress. Vital signs are significant for mildly elevated blood pressure. Oxygen saturation is 99%, which is normal. Head is normocephalic and atraumatic. PERRLA, EOMI. Oropharynx is clear. Neck is nontender and supple without adenopathy or JVD. Back is nontender and there is no CVA tenderness. Lungs are clear without rales, wheezes, or rhonchi. Chest is nontender. Heart has regular rate and rhythm without murmur. Abdomen is soft, flat, nontender. Extremities have no cyanosis or edema, full range of motion is present. Skin is warm and dry without rash. Neurologic: Mental status is normal, cranial nerves are intact, moves all extremities equally.  ED Results / Procedures / Treatments   Labs (all labs ordered are listed, but only abnormal results are displayed) Labs Reviewed  BASIC METABOLIC PANEL  CBC WITH DIFFERENTIAL/PLATELET  TROPONIN I (HIGH SENSITIVITY)    EKG EKG Interpretation Date/Time:  Monday October 29 2022 04:57:38 EDT Ventricular Rate:  60 PR Interval:  159 QRS Duration:  105 QT Interval:  383 QTC Calculation: 383 R Axis:   85  Text Interpretation: Sinus rhythm Low voltage, precordial leads When compared with ECG of 09/22/2019, No significant change was found Confirmed by Dione Booze (  [id number]) on 10/29/2022 5:04:06 AM  Radiology DG Chest 2 View  Result Date: 10/29/2022 CLINICAL DATA:  42 year old male with history of chest pain and chest pressure. EXAM: CHEST - 2 VIEW COMPARISON:  Chest x-ray 09/22/2019. FINDINGS: Lung volumes are normal. No consolidative airspace disease. No pleural effusions. No pneumothorax. No pulmonary nodule or mass noted. Pulmonary vasculature and the cardiomediastinal silhouette are within normal limits. IMPRESSION: No radiographic evidence of acute cardiopulmonary disease. Electronically Signed   By: Trudie Reed M.D.   On: 10/29/2022 05:59     Procedures Procedures  Cardiac monitor shows normal sinus rhythm, per my interpretation.  Medications Ordered in ED Medications - No data to display  ED Course/ Medical Decision Making/ A&P                                 Medical Decision Making Amount and/or Complexity of Data Reviewed Labs: ordered. Radiology: ordered.  Risk OTC drugs. Prescription drug management.   Chest pain with both typical and atypical components.  Pleuritic pain is concerning for possible pulmonary embolism.  Consider GERD, ACS, pericarditis.  I have reviewed and interpreted his electrocardiogram, my interpretation is no acute ST or T changes.  I have ordered dose of aspirin and also therapeutic trial of nitroglycerin.  Have ordered laboratory workup of CBC, basic metabolic panel, troponin x 2, D-dimer.  I have reviewed his past records, and in 2013 he had a non-STEMI with cardiac catheterization showing clean coronaries and presumption of coronary artery spasm.  He is no longer taking his calcium channel blocker, which would put him at risk for recurrent coronary artery spasm.  Chest x-ray shows no acute cardiopulmonary process.  I have independently viewed the images, and agree with the radiologist's interpretation.  I have reviewed and interpreted his laboratory tests and my interpretation is normal CBC, mild renal insufficiency which is new compared with 09/22/2019 and likely reflects progression of underlying hypertension.  Troponin is normal x 2, no evidence of acute cardiac injury.  Patient never actually received nitroglycerin.  He also relates that he started having pain on discontinuing amlodipine.  I am giving him a prescription for amlodipine and an initial dose in the emergency department.  I am also giving him a prescription for nitroglycerin to use as needed and I am giving him an ambulatory referral to cardiology.  Final Clinical Impression(s) / ED Diagnoses Final diagnoses:  Nonspecific chest  pain  Renal insufficiency    Rx / DC Orders ED Discharge Orders          Ordered    amLODipine (NORVASC) 5 MG tablet  Daily        10/29/22 0732    nitroGLYCERIN (NITROSTAT) 0.4 MG SL tablet  Every 5 min PRN        10/29/22 0732    Ambulatory referral to Cardiology       Comments: If you have not heard from the Cardiology office within the next 72 hours please call [phone].   10/29/22 0732              Dione Booze, MD 10/29/22 [phone]

## 2022-10-29 NOTE — Discharge Instructions (Signed)
Please follow-up with the cardiologist.  Return to the emergency department if you are having any problems.

## 2022-10-29 NOTE — ED Notes (Signed)
Patient transported to X-ray 

## 2023-01-04 ENCOUNTER — Encounter: Payer: Self-pay | Admitting: Internal Medicine

## 2023-01-04 ENCOUNTER — Ambulatory Visit: Payer: Medicaid Other | Attending: Internal Medicine | Admitting: Internal Medicine

## 2023-01-04 NOTE — Progress Notes (Signed)
Erroneous encounter - please disregard.

## 2023-03-11 ENCOUNTER — Encounter: Payer: Self-pay | Admitting: Internal Medicine

## 2023-03-11 ENCOUNTER — Ambulatory Visit: Payer: Medicaid Other | Attending: Internal Medicine | Admitting: Internal Medicine

## 2023-03-11 NOTE — Progress Notes (Signed)
 Erroneous encounter - please disregard.

## 2023-07-12 ENCOUNTER — Other Ambulatory Visit (HOSPITAL_COMMUNITY): Payer: Self-pay

## 2023-12-27 ENCOUNTER — Other Ambulatory Visit (HOSPITAL_COMMUNITY): Payer: Self-pay
